# Patient Record
Sex: Male | Born: 1980 | Race: Black or African American | Hispanic: No | Marital: Single | Smoking: Current every day smoker
Health system: Southern US, Community
[De-identification: ages and names within clinical notes are randomized; demographics above are authoritative.]

---
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
## 2004-12-03 ENCOUNTER — Emergency Department (HOSPITAL_COMMUNITY): Admission: EM | Admit: 2004-12-03 | Discharge: 2004-12-03 | Payer: Self-pay | Admitting: Emergency Medicine

## 2005-08-06 ENCOUNTER — Emergency Department (HOSPITAL_COMMUNITY): Admission: EM | Admit: 2005-08-06 | Discharge: 2005-08-06 | Payer: Self-pay | Admitting: Emergency Medicine

## 2006-02-15 ENCOUNTER — Inpatient Hospital Stay (HOSPITAL_COMMUNITY): Admission: EM | Admit: 2006-02-15 | Discharge: 2006-02-20 | Payer: Self-pay | Admitting: Emergency Medicine

## 2007-01-02 ENCOUNTER — Emergency Department (HOSPITAL_COMMUNITY): Admission: EM | Admit: 2007-01-02 | Discharge: 2007-01-02 | Payer: Self-pay | Admitting: Emergency Medicine

## 2007-06-13 ENCOUNTER — Emergency Department (HOSPITAL_COMMUNITY): Admission: EM | Admit: 2007-06-13 | Discharge: 2007-06-14 | Payer: Self-pay | Admitting: Emergency Medicine

## 2009-05-01 IMAGING — CR DG FOOT COMPLETE 3+V*R*
3 series · 3 of 3 positions shown · non-contrast
Comparison: None

CLINICAL DATA: Right foot injury with medial pain

RIGHT FOOT COMPLETE - 3+ VIEW

[t foot ap right *]
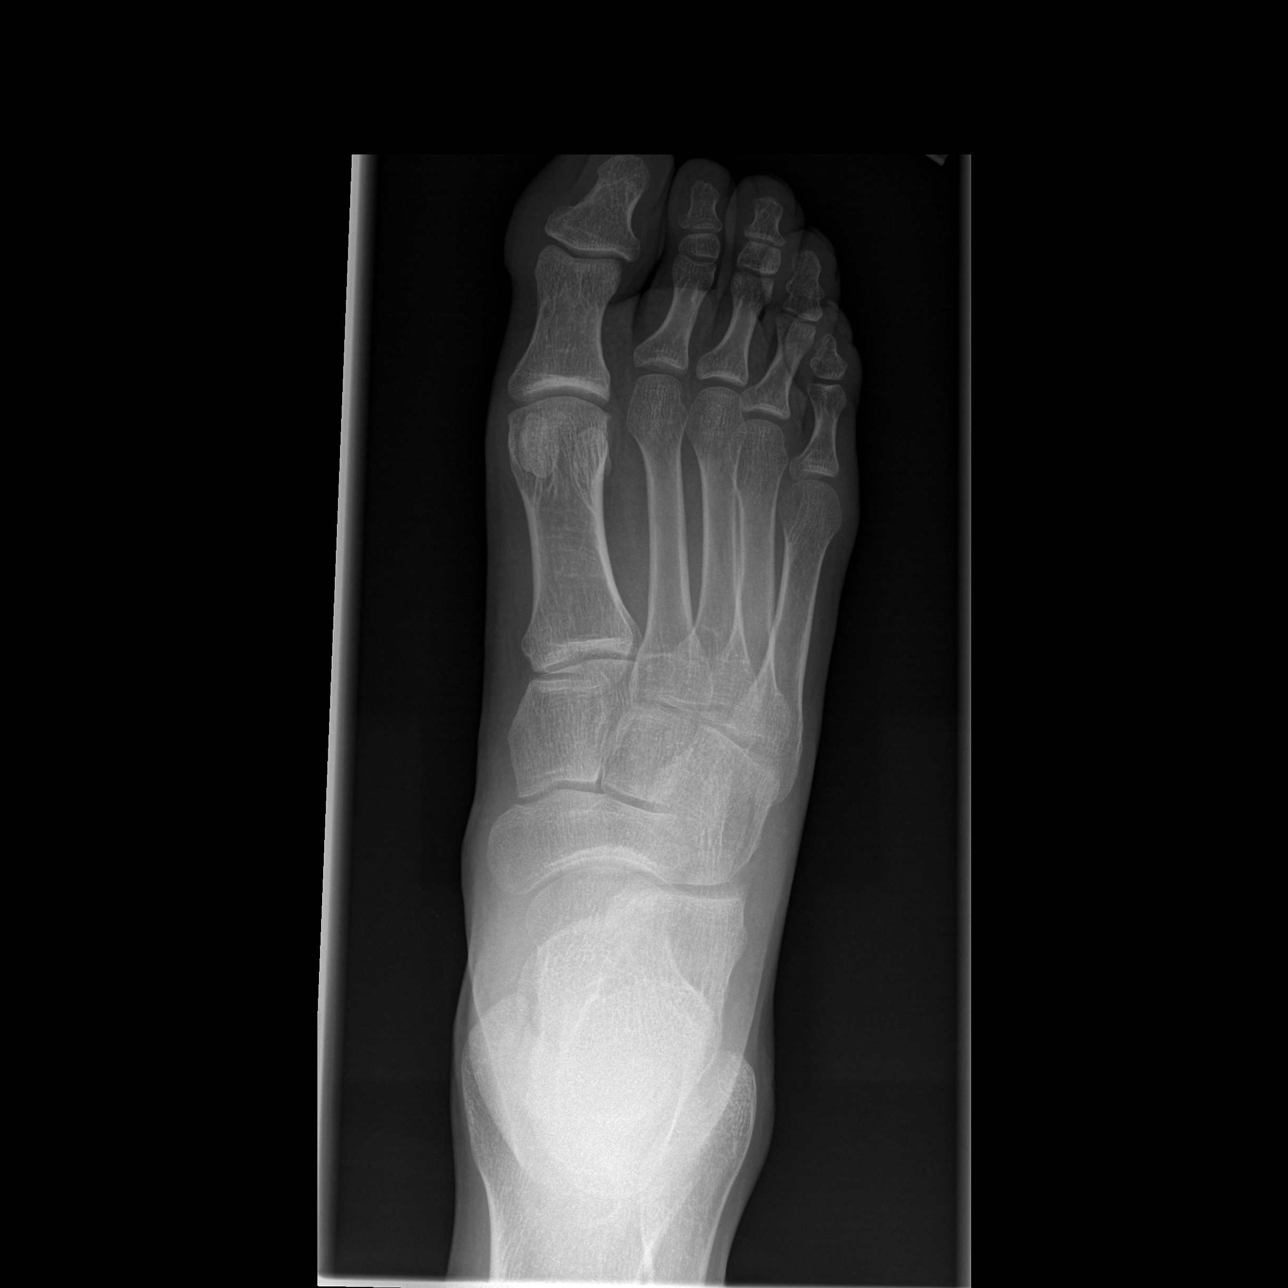

[t foot oblique right *]
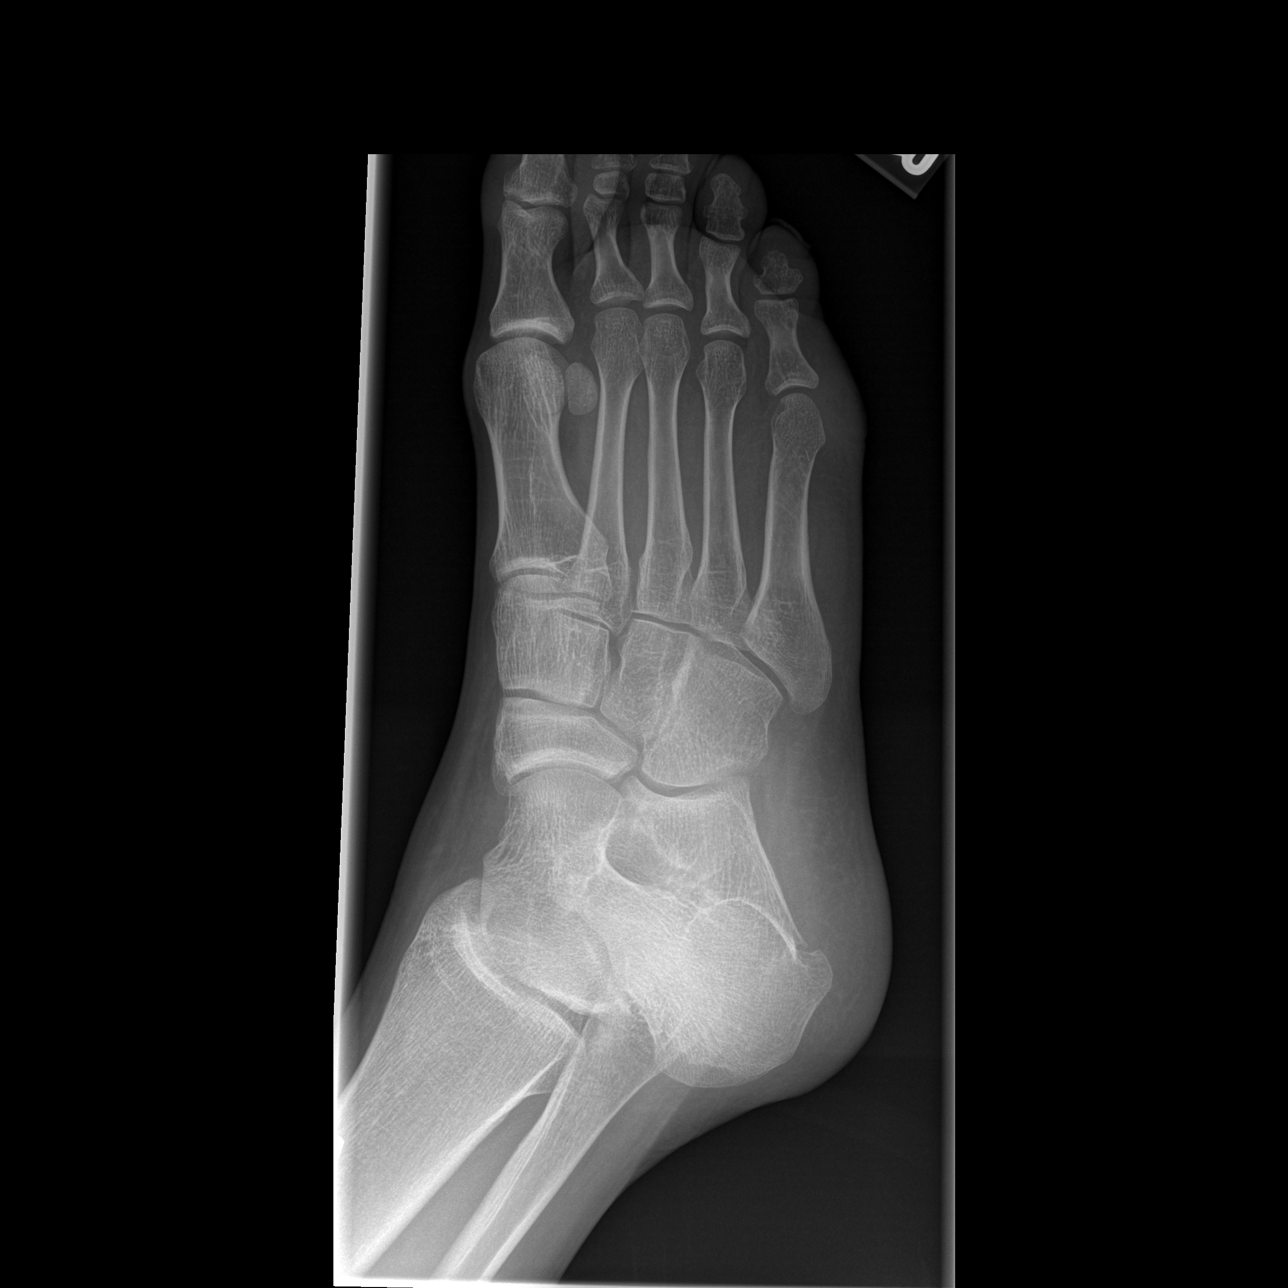

[t foot lat right *]
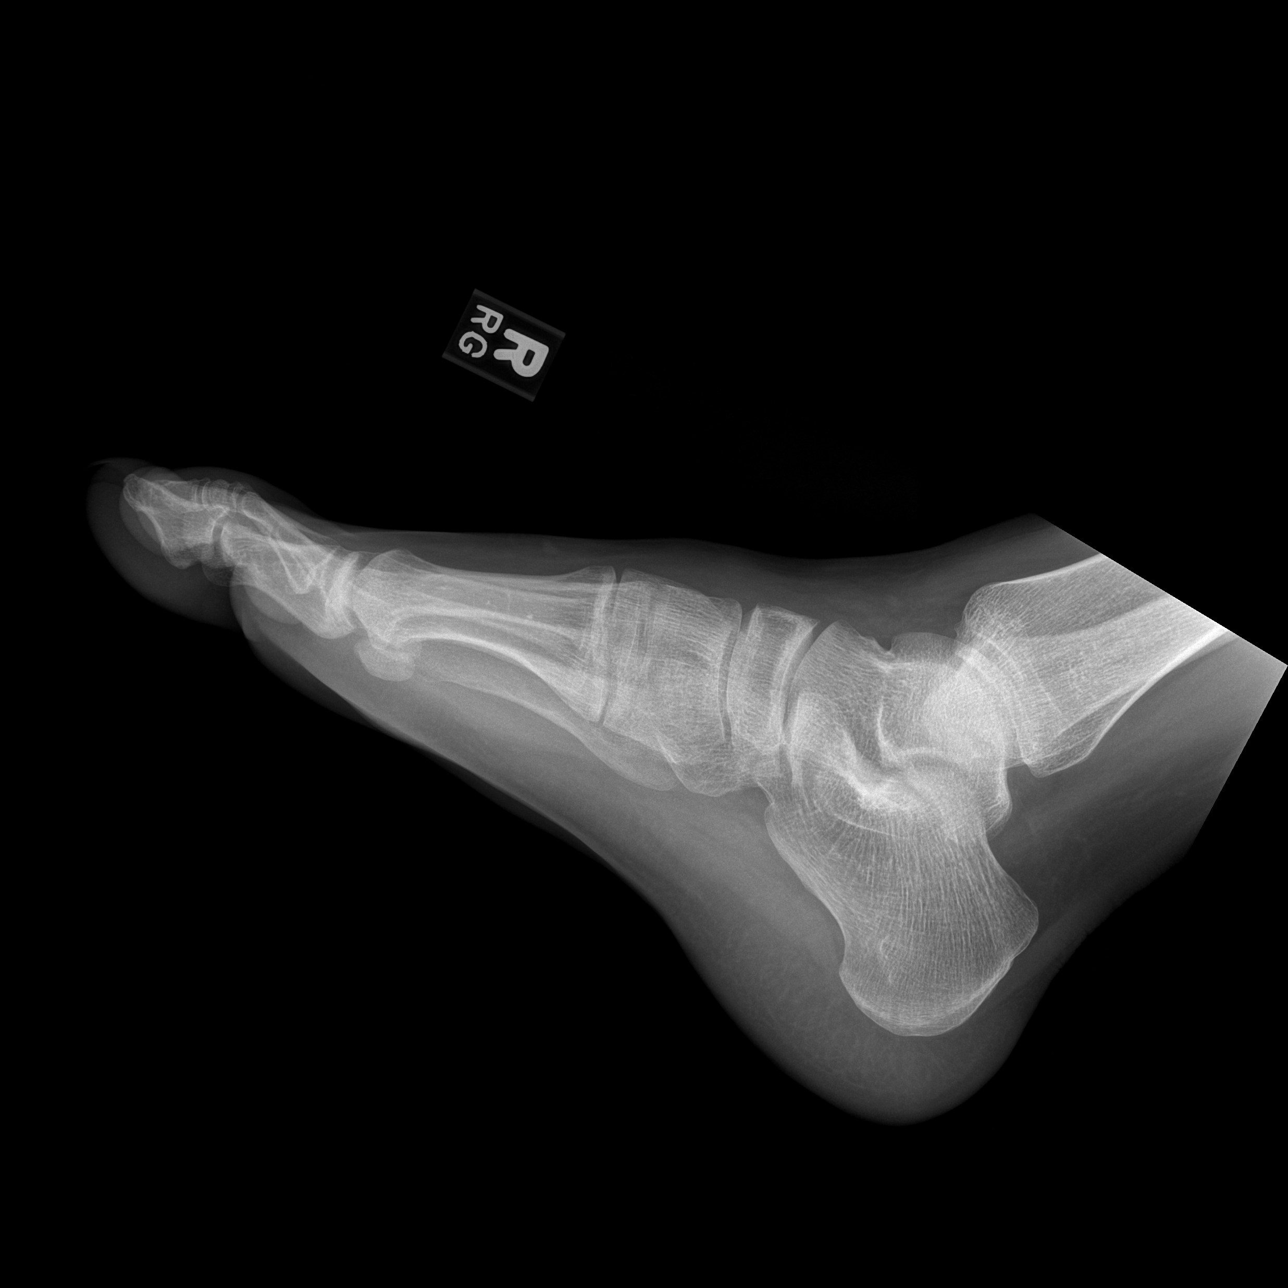

[3 of 3 positions shown; findings below may reference images not displayed]

FINDINGS: There is no evidence of fracture or dislocation.  There
is no evidence of arthropathy or other focal bone abnormality.
Soft tissues are unremarkable.
IMPRESSION: Negative.

## 2009-10-24 ENCOUNTER — Emergency Department (HOSPITAL_COMMUNITY): Admission: EM | Admit: 2009-10-24 | Discharge: 2009-10-24 | Payer: Self-pay | Admitting: Emergency Medicine

## 2010-06-15 NOTE — H&P (Signed)
[name], [name]                ACCOUNT NO.:  [id number]   MEDICAL RECORD NO.:  [id number]          PATIENT TYPE:  INP   LOCATION:  3709                         FACILITY:  [facility]   PHYSICIAN:  Della Goo, M.D. DATE OF BIRTH:  05/25/1980   DATE OF ADMISSION:  02/15/2006  DATE OF DISCHARGE:                              HISTORY & PHYSICAL   CHIEF COMPLAINT:  Dark urine.   HISTORY OF PRESENT ILLNESS:  This is a 30 year old male previously in  his state of good health until the a.m. prior to admission when he began  to pass dark brown urine.  He denies having any major discomfort,  reports having an exercise work a few days ago and has muscle soreness  in the shoulders from his work out.  He denies having any fevers,  chills, nausea, vomiting, chest pain.   The patient was evaluated in the emergency department, found to have an  elevated creatinine kinase level of 183,000 and was referred for  admission.   PAST MEDICAL HISTORY:  None.   MEDICATIONS:  None.   ALLERGIES:  No known drug allergies.   SOCIAL HISTORY:  Patient is a smoker, smokes one pack per day.  He  reports occasional alcohol usage and reports occasional cannabis usage.  Denies any other drug usages.   FAMILY HISTORY:  Positive history of hypertension in his mother.  History of gout in his family.  No history of diabetes, coronary artery  disease or cancer that he knows of.   REVIEW OF SYSTEMS:  Pertinents are mentioned above.   PHYSICAL EXAMINATION FINDINGS:  A 30 year old, well-nourished, well-  developed male in no acute distress.  Temperature 97.8, blood pressure 131/88, heart rate 60-76, respirations  16-20 and O2 saturations 98%.  HEENT:  Normocephalic, atraumatic.  Pupils equally round, reactive to  light.  Extraocular muscles are intact.  There is no scleral icterus.  Oropharynx clear.  No exudates or erythema.  NECK:  Supple.  Full range of motion.  No thyromegaly, adenopathy,  jugular venous  distension.  CARDIOVASCULAR:  Regular rate and rhythm.  LUNGS:  Clear to auscultation bilaterally.  ABDOMEN:  Positive bowel sounds, soft, nontender, nondistended.  EXTREMITIES:  Without edema.  No costovertebral angle tenderness.  No  spinous process tenderness.  NEUROLOGIC EXAMINATION:  Alert and oriented x3.  There are no focal  deficits.  GENITOURINARY/RECTAL:  Deferred.   LABORATORY STUDIES:  Reveal a white blood cell count 7.5, hemoglobin  15.1, hematocrit 45.7, MCV 94.8, platelets 221, neutrophils 64%,  lymphocytes 26%.  Urinalysis negative except for trace leukocyte  esterase.  Total CK level 183,691.  Sodium 139, potassium 3.7, chloride  110, bicarb 22.3, BUN 5, creatinine 0.9, glucose 80.  Liver function  tests:  Albumin 4.0, AST 1250, ALT 169.   ASSESSMENT:  A 30 year old male being admitted with:  1. Rhabdomyolysis.  2. Dehydration.  3. Elevated transaminases.   PLAN:  The patient will be admitted and continue receiving IV fluids for  rehydration therapy.  The patient will be placed on IV fluid with D5W  and 3 ampules of bicarb.  His electrolytes will be monitored, and the  CPK/CK levels will be checked daily along with the liver function test.      Della Goo, M.D.  Electronically Signed     HJ/MEDQ  D:  02/16/2006  T:  02/16/2006  Job:  [id number]

## 2010-06-15 NOTE — Discharge Summary (Signed)
Gary [name], Gary [name]                ACCOUNT NO.:  [id number]   MEDICAL RECORD NO.:  [id number]          PATIENT TYPE:  INP   LOCATION:  6743                         FACILITY:  [facility]   PHYSICIAN:  Elliot Cousin, M.D.    DATE OF BIRTH:  12-19-1980   DATE OF ADMISSION:  02/15/2006  DATE OF DISCHARGE:  02/20/2006                               DISCHARGE SUMMARY   DISCHARGE DIAGNOSES:  1. Rhabdomyolysis precipitated by weight lifting.  The patient's      creatine kinase on admission was 183,691.  His creatine kinase      prior to hospital discharge was 24,159.  2. Hepatic transaminitis thought to be secondary to rhabdomyolysis.  3. Tobacco abuse.  4. Asymptomatic bradycardia.  5. Human immunodeficiency virus serology pending at the time of      hospital discharge.   DISCHARGE MEDICATIONS:  1. Tylenol 500 mg 1 tablet every 6-8 hours as needed for pain (do not      exceed 4 grams daily).  2. Ibuprofen 200 mg take 1-2 tablets every 4-6 hours as needed for      pain.   DISCHARGE DISPOSITION:  The patient was discharged to home in improved  and stable condition on January24,2008.  He was advised to follow up  with the [facility] in 5-7 days.  He was also advised to call  the [facility] hospitalist office at [phone] on January28,2008 for the  results of the HIV serology.   HISTORY OF PRESENT ILLNESS:  The patient is a 30 year old man with no  significant past medical history, who presented to the emergency  department on January19,2008 with a chief complaint of urinating dark  brown urine.  He also complained of muscle soreness in his shoulders  after lifting weights a few days ago.  He denied any other complaints at  the time.  When the patient was evaluated in the emergency department,  he was found to have a creatine kinase of 183,691.  He was, therefore,  admitted for further evaluation and management.   HOSPITAL COURSE:  1. RHABDOMYOLYSIS.  The patient was started on  aggressive IV fluid      volume repletion with D5W with 3 ampules of bicarbonate  added at      250 cc an hour.  His creatine kinase levels were monitored daily.      His phosphorus and uric acid levels were also assessed and found to      be within normal limits.  It was noted that his liver transaminases      were elevated, as well.  On admission, his SGOT was 1250, and his      SGPT was 169.  The patient had no complaints of abdominal pain      and/or right upper quadrant pain during the hospitalization.  His      TSH was assessed, and it was within normal limits at 1.66.  Over      the course of the hospitalization, his urine output was closely      monitored.  The goal was to ensure that the patient's  urine output      was greater than 200 cc an hour.  During the first 48 hours of      hospitalization, his CK decreased to 149,756.  His IV fluids were      adjusted to normal saline without any bicarbonate added.  On      hospital day #4, his CK fell dramatically to approximately 98,000.      He began to feel symptomatically improved without any significant      muscle pain or myalgias.  His liver transaminases improved, as      well.  On hospital day #4, his SGOT was 909 and his SGPT was 166.      On the day of hospital discharge, his creatine kinase fell again to      24,159.  His SGOT improved, as well, to 377, and his SGPT fell to      121.  Obviously, the patient's creatine kinase and liver      transaminases were still elevated.  However, because he was      asymptomatic and the levels were significantly improved, he was      discharged to home.  The patient was anxious to be discharged, as      he had a relative who was also sick in the hospital.  He was      completely asymptomatic at the time of hospital discharge.  Prior      to his discharge, an HIV serology was ordered, as acute HIV      infection can cause rhabdomyolysis.  The results of the HIV      serology are pending .   The patient was advised to call the      [facility] office on Monday, January28,2008, for the results of the      HIV serology.  Prior to hospital discharge, the patient was also admonished to drink at  least two to three quarts of fluids daily including Gatorade, water,  juice and milk as tolerated.  He was strongly advised to follow up with  the [facility] clinic in 5-7 days to have his CK, liver transaminases,  and electrolytes reassessed.  The patient voiced understanding.  The  patient's renal function remained well within normal limits during the  hospitalization.  His BUN prior to hospital discharge was 6, and his  creatinine prior to hospital discharge was 0.9.   1. TOBACCO ABUSE.  The patient was counseled on tobacco cessation      during the hospital course.   1. ASYMPTOMATIC BRADYCARDIA.  The patient's heart rate ranged between      50 and 65 during the hospital course.  His EKG revealed a      junctional rhythm.  The patient was completely asymptomatic at the      time.  This should be followed by his primary care physician at the      [facility].   DISCHARGE LABORATORY DATA:  HIV serology pending.  Sodium 141, potassium  3.5, chloride 106, CO2 of 28, glucose 87, BUN 6, creatinine 0.9, total  bilirubin 1.6, alkaline phosphatase 45, SGOT of 377, SGPT of 121, total  protein 6.4, albumin 3.3, calcium 9.4, CK 24,159, phosphorus 4.3,  magnesium 1.9, uric acid 4.6.      Elliot Cousin, M.D.  Electronically Signed     DF/MEDQ  D:  02/23/2006  T:  02/23/2006  Job:  [id number]

## 2011-03-19 ENCOUNTER — Emergency Department (HOSPITAL_COMMUNITY): Payer: Self-pay

## 2011-03-19 ENCOUNTER — Emergency Department (HOSPITAL_COMMUNITY)
Admission: EM | Admit: 2011-03-19 | Discharge: 2011-03-19 | Disposition: A | Discharge status: Home / Self Care | Payer: Self-pay | Attending: Emergency Medicine | Admitting: Emergency Medicine

## 2011-03-19 ENCOUNTER — Encounter (HOSPITAL_COMMUNITY): Payer: Self-pay | Admitting: Emergency Medicine

## 2011-03-19 DIAGNOSIS — E876 Hypokalemia: Secondary | ICD-10-CM | POA: Insufficient documentation

## 2011-03-19 DIAGNOSIS — R109 Unspecified abdominal pain: Secondary | ICD-10-CM | POA: Insufficient documentation

## 2011-03-19 DIAGNOSIS — F17200 Nicotine dependence, unspecified, uncomplicated: Secondary | ICD-10-CM | POA: Insufficient documentation

## 2011-03-19 DIAGNOSIS — R112 Nausea with vomiting, unspecified: Secondary | ICD-10-CM | POA: Insufficient documentation

## 2011-03-19 LAB — URINALYSIS, ROUTINE W REFLEX MICROSCOPIC
Bilirubin Urine: NEGATIVE
Glucose, UA: NEGATIVE mg/dL
Hgb urine dipstick: NEGATIVE
Ketones, ur: NEGATIVE mg/dL
Leukocytes, UA: NEGATIVE
Protein, ur: NEGATIVE mg/dL
pH: 6 (ref 5.0–8.0)

## 2011-03-19 LAB — CBC
Hemoglobin: 14.3 g/dL (ref 13.0–17.0)
MCHC: 36.3 g/dL — ABNORMAL HIGH (ref 30.0–36.0)
Platelets: 274 10*3/uL (ref 150–400)
RBC: 4.63 MIL/uL (ref 4.22–5.81)

## 2011-03-19 LAB — COMPREHENSIVE METABOLIC PANEL
ALT: 25 U/L (ref 0–53)
AST: 26 U/L (ref 0–37)
Albumin: 4.6 g/dL (ref 3.5–5.2)
Alkaline Phosphatase: 68 U/L (ref 39–117)
BUN: 8 mg/dL (ref 6–23)
Chloride: 104 mEq/L (ref 96–112)
Potassium: 3.3 mEq/L — ABNORMAL LOW (ref 3.5–5.1)
Sodium: 140 mEq/L (ref 135–145)
Total Bilirubin: 0.5 mg/dL (ref 0.3–1.2)
Total Protein: 8 g/dL (ref 6.0–8.3)

## 2011-03-19 LAB — DIFFERENTIAL
Basophils Relative: 0 % (ref 0–1)
Eosinophils Absolute: 0 10*3/uL (ref 0.0–0.7)
Monocytes Relative: 9 % (ref 3–12)
Neutro Abs: 5.1 10*3/uL (ref 1.7–7.7)
Neutrophils Relative %: 68 % (ref 43–77)

## 2011-03-19 MED ORDER — HYDROMORPHONE HCL PF 1 MG/ML IJ SOLN
1.0000 mg | Freq: Once | INTRAMUSCULAR | Status: AC
  Administered 2011-03-19: 1 mg via INTRAVENOUS
  Filled 2011-03-19: qty 1

## 2011-03-19 MED ORDER — POTASSIUM CHLORIDE CRYS ER 20 MEQ PO TBCR
40.0000 meq | EXTENDED_RELEASE_TABLET | Freq: Once | ORAL | Status: AC
  Administered 2011-03-19: 40 meq via ORAL
  Filled 2011-03-19 (×2): qty 1

## 2011-03-19 MED ORDER — OXYCODONE-ACETAMINOPHEN 5-325 MG PO TABS: 2.0000 | ORAL_TABLET | ORAL | Status: AC | PRN

## 2011-03-19 MED ORDER — ONDANSETRON HCL 4 MG PO TABS: 4.0000 mg | ORAL_TABLET | Freq: Four times a day (QID) | ORAL | Status: AC

## 2011-03-19 MED ORDER — ONDANSETRON HCL 4 MG/2ML IJ SOLN
4.0000 mg | Freq: Once | INTRAMUSCULAR | Status: AC
  Administered 2011-03-19: 4 mg via INTRAVENOUS
  Filled 2011-03-19: qty 2

## 2011-03-19 MED ORDER — SODIUM CHLORIDE 0.9 % IV BOLUS (SEPSIS)
1000.0000 mL | Freq: Once | INTRAVENOUS | Status: AC
  Administered 2011-03-19: 1000 mL via INTRAVENOUS

## 2011-03-19 NOTE — ED Provider Notes (Addendum)
History     CSN: [id number]  Arrival date & time 03/19/11  4098   First MD Initiated Contact with Patient 03/19/11 2031      Chief Complaint  Patient presents with  . Abdominal Pain   Patient states pain began at 11 AM this morning. It is epigastric and diffuse. He also had some loose stool and states he vomited one time when he got to the ER. He states he took either Tylenol or Motrin for the pain. He has had no fever. He denies any pain in his groin or genital area. He has had no sick contacts. Patient denies any chronic medical problems. He does smoke cigarettes. Denies use of any alcohol or illicit drugs. The pain that he describes as nonradiating, and somewhat difficult to localize. The pain is worse with movement or palpation.  (Consider location/radiation/quality/duration/timing/severity/associated sxs/prior treatment) Patient is a 31 y.o. male presenting with abdominal pain. The history is provided by the patient.  Abdominal Pain The primary symptoms of the illness include abdominal pain. The primary symptoms of the illness do not include dysuria.    History reviewed. No pertinent past medical history.  History reviewed. No pertinent past surgical history.  No family history on file.  History  Substance Use Topics  . Smoking status: Current Everyday Smoker -- 1.0 packs/day    Types: Cigarettes  . Smokeless tobacco: Not on file  . Alcohol Use: No      Review of Systems  Gastrointestinal: Positive for abdominal pain.  Genitourinary: Negative for dysuria.  All other systems reviewed and are negative.    Allergies  Review of patient's allergies indicates no known allergies.  Home Medications  No current outpatient prescriptions on file.  BP 118/72  Pulse 89  Temp(Src) 98.1 F (36.7 C) (Oral)  Resp 22  SpO2 100%  Physical Exam  Nursing note and vitals reviewed. Constitutional: He is oriented to person, place, and time. He appears well-developed and  well-nourished. No distress.  HENT:  Head: Normocephalic and atraumatic.  Eyes: Conjunctivae and EOM are normal. Pupils are equal, round, and reactive to light.  Neck: Neck supple.  Cardiovascular: Normal rate and regular rhythm.  Exam reveals no gallop and no friction rub.   No murmur heard. Pulmonary/Chest: Breath sounds normal. He has no wheezes. He has no rales. He exhibits no tenderness.  Abdominal: Soft. Bowel sounds are normal. He exhibits no distension. There is tenderness. There is no rebound and no guarding.       Diffuse tenderness in the epigastric region. There is no focal tenderness in the right lower quadrant. No suprapubic tenderness. Bowel sounds normal to slightly hyperactive. There is no rebound, rigidity or guarding  Musculoskeletal: Normal range of motion.  Neurological: He is alert and oriented to person, place, and time. No cranial nerve deficit. Coordination normal.  Skin: Skin is warm and dry. No rash noted. He is not diaphoretic.  Psychiatric: He has a normal mood and affect.    ED Course  Procedures (including critical care time)   Labs Reviewed  CBC  DIFFERENTIAL  COMPREHENSIVE METABOLIC PANEL  URINALYSIS, ROUTINE W REFLEX MICROSCOPIC  LIPASE, BLOOD   No results found.   No diagnosis found.    MDM  Patient is seen and examined, initial history and physical is completed. Evaluation initiated    Results for orders placed during the hospital encounter of 03/19/11  CBC      Component Value Range   WBC 7.5  4.0 - 10.5 (  K/uL)   RBC 4.63  4.22 - 5.81 (MIL/uL)   Hemoglobin 14.3  13.0 - 17.0 (g/dL)   HCT 40.9  81.1 - 91.4 (%)   MCV 85.1  78.0 - 100.0 (fL)   MCH 30.9  26.0 - 34.0 (pg)   MCHC 36.3 (*) 30.0 - 36.0 (g/dL)   RDW 78.2  95.6 - 21.3 (%)   Platelets 274  150 - 400 (K/uL)  DIFFERENTIAL      Component Value Range   Neutrophils Relative 68  43 - 77 (%)   Neutro Abs 5.1  1.7 - 7.7 (K/uL)   Lymphocytes Relative 22  12 - 46 (%)   Lymphs Abs  1.6  0.7 - 4.0 (K/uL)   Monocytes Relative 9  3 - 12 (%)   Monocytes Absolute 0.7  0.1 - 1.0 (K/uL)   Eosinophils Relative 0  0 - 5 (%)   Eosinophils Absolute 0.0  0.0 - 0.7 (K/uL)   Basophils Relative 0  0 - 1 (%)   Basophils Absolute 0.0  0.0 - 0.1 (K/uL)  COMPREHENSIVE METABOLIC PANEL      Component Value Range   Sodium 140  135 - 145 (mEq/L)   Potassium 3.3 (*) 3.5 - 5.1 (mEq/L)   Chloride 104  96 - 112 (mEq/L)   CO2 20  19 - 32 (mEq/L)   Glucose, Bld 111 (*) 70 - 99 (mg/dL)   BUN 8  6 - 23 (mg/dL)   Creatinine, Ser 0.86  0.50 - 1.35 (mg/dL)   Calcium 9.3  8.4 - 57.8 (mg/dL)   Total Protein 8.0  6.0 - 8.3 (g/dL)   Albumin 4.6  3.5 - 5.2 (g/dL)   AST 26  0 - 37 (U/L)   ALT 25  0 - 53 (U/L)   Alkaline Phosphatase 68  39 - 117 (U/L)   Total Bilirubin 0.5  0.3 - 1.2 (mg/dL)   GFR calc non Af Amer >90  >90 (mL/min)   GFR calc Af Amer >90  >90 (mL/min)  URINALYSIS, ROUTINE W REFLEX MICROSCOPIC      Component Value Range   Color, Urine YELLOW  YELLOW    APPearance CLEAR  CLEAR    Specific Gravity, Urine 1.016  1.005 - 1.030    pH 6.0  5.0 - 8.0    Glucose, UA NEGATIVE  NEGATIVE (mg/dL)   Hgb urine dipstick NEGATIVE  NEGATIVE    Bilirubin Urine NEGATIVE  NEGATIVE    Ketones, ur NEGATIVE  NEGATIVE (mg/dL)   Protein, ur NEGATIVE  NEGATIVE (mg/dL)   Urobilinogen, UA 0.2  0.0 - 1.0 (mg/dL)   Nitrite NEGATIVE  NEGATIVE    Leukocytes, UA NEGATIVE  NEGATIVE   LIPASE, BLOOD      Component Value Range   Lipase 22  11 - 59 (U/L)   Dg Abd Acute W/chest  03/19/2011  *RADIOLOGY REPORT*  Clinical Data: Mid abdominal pain  ACUTE ABDOMEN SERIES (ABDOMEN 2 VIEW & CHEST 1 VIEW)  Comparison: 01/02/2007  Findings: Low lung volumes accentuate the vascular markings. Normal heart size.  No definite focal pneumonia, collapse, consolidation, effusion or pneumothorax.  Trachea midline.  No free air.  Nonobstructive bowel gas pattern.  No significant dilatation or ileus.  No abnormal calcifications or  osseous finding.  Pelvic calcifications likely venous phlebolith.  IMPRESSION: No acute findings in the chest or abdomen by plain radiography.  Original Report Authenticated By: Judie Petit. Ruel Favors, M.D.      Initial diagnostic studies, fairly unremarkable.  White count is normal. LFTs normal. Lipase is normal. Urine is normal. Abdominal x-rays normal.   Patient does feel much better. He is walking around pushing of the stomach, and, states the pain is completely gone. I feel the etiology is likely secondary to a viral gastroenteritis . He was told to take clear liquids for the next 24 hours. He is given prescriptions. Patient is instructed to return to the ED for any concerns or changing or persistent symptoms, but at this time. His pain is completely resolved    Rainie Crenshaw A. Patrica Duel, MD 03/19/11 2326  Lorelle Gibbs. Patrica Duel, MD 03/19/11 2330

## 2011-03-19 NOTE — Discharge Instructions (Signed)
Your test in the ER this evening were normal, please return to the ER  for any concerns or changing symptoms. Otherwise, as discussed take a very bland diet or just clear liquids for the next 24 hours. Take medications as prescribed. Follow instructions below    Abdominal Pain (Nonspecific) Your exam might not show the exact reason you have abdominal pain. Since there are many different causes of abdominal pain, another checkup and more tests may be needed. It is very important to follow up for lasting (persistent) or worsening symptoms. A possible cause of abdominal pain in any person who still has his or her appendix is acute appendicitis. Appendicitis is often hard to diagnose. Normal blood tests, urine tests, ultrasound, and CT scans do not completely rule out early appendicitis or other causes of abdominal pain. Sometimes, only the changes that happen over time will allow appendicitis and other causes of abdominal pain to be determined. Other potential problems that may require surgery may also take time to become more apparent. Because of this, it is important that you follow all of the instructions below. HOME CARE INSTRUCTIONS   Rest as much as possible.   Do not eat solid food until your pain is gone.   While adults or children have pain: A diet of water, weak decaffeinated tea, broth or bouillon, gelatin, oral rehydration solutions (ORS), frozen ice pops, or ice chips may be helpful.   When pain is gone in adults or children: Start a light diet (dry toast, crackers, applesauce, or white rice). Increase the diet slowly as long as it does not bother you. Eat no dairy products (including cheese and eggs) and no spicy, fatty, fried, or high-fiber foods.   Use no alcohol, caffeine, or cigarettes.   Take your regular medicines unless your caregiver told you not to.   Take any prescribed medicine as directed.   Only take over-the-counter or prescription medicines for pain, discomfort, or  fever as directed by your caregiver. Do not give aspirin to children.  If your caregiver has given you a follow-up appointment, it is very important to keep that appointment. Not keeping the appointment could result in a permanent injury and/or lasting (chronic) pain and/or disability. If there is any problem keeping the appointment, you must call to reschedule.  SEEK IMMEDIATE MEDICAL CARE IF:   Your pain is not gone in 24 hours.   Your pain becomes worse, changes location, or feels different.   You or your child has an oral temperature above 102 F (38.9 C), not controlled by medicine.   Your baby is older than 3 months with a rectal temperature of 102 F (38.9 C) or higher.   Your baby is 30 months old or younger with a rectal temperature of 100.4 F (38 C) or higher.   You have shaking chills.   You keep throwing up (vomiting) or cannot drink liquids.   There is blood in your vomit or you see blood in your bowel movements.   Your bowel movements become dark or black.   You have frequent bowel movements.   Your bowel movements stop (become blocked) or you cannot pass gas.   You have bloody, frequent, or painful urination.   You have yellow discoloration in the skin or whites of the eyes.   Your stomach becomes bloated or bigger.   You have dizziness or fainting.   You have chest or back pain.  MAKE SURE YOU:   Understand these instructions.   Will  watch your condition.   Will get help right away if you are not doing well or get worse.  Document Released: 01/14/2005 Document Revised: 09/26/2010 Document Reviewed: 12/12/2008 [facility] Patient Information 2012 [place], [place].   Viral Gastroenteritis Viral gastroenteritis is also known as stomach flu. This condition affects the stomach and intestinal tract. The illness typically lasts 3 to 8 days. Most people develop an immune response. This eventually gets rid of the virus. While this natural response develops, the  virus can make you quite ill.  CAUSES  Diarrhea and vomiting are often caused by a virus. Medicines (antibiotics) that kill germs will not help unless there is also a germ (bacterial) infection. SYMPTOMS  The most common symptom is diarrhea. This can cause severe loss of fluids (dehydration) and body salt (electrolyte) imbalance. TREATMENT  Treatments for this illness are aimed at rehydration. Antidiarrheal medicines are not recommended. They do not decrease diarrhea volume and may be harmful. Usually, home treatment is all that is needed. The most serious cases involve vomiting so severely that you are not able to keep down fluids taken by mouth (orally). In these cases, intravenous (IV) fluids are needed. Vomiting with viral gastroenteritis is common, but it will usually go away with treatment. HOME CARE INSTRUCTIONS  Small amounts of fluids should be taken frequently. Large amounts at one time may not be tolerated. Plain water may be harmful in infants and the elderly. Oral rehydration solutions (ORS) are available at pharmacies and grocery stores. ORS replace water and important electrolytes in proper proportions. Sports drinks are not as effective as ORS and may be harmful due to sugars worsening diarrhea.  As a general guideline for children, replace any new fluid losses from diarrhea or vomiting with ORS as follows:   If your child weighs 22 pounds or under (10 kg or less), give 60-120 mL (1/4 - 1/2 cup or 2 - 4 ounces) of ORS for each diarrheal stool or vomiting episode.   If your child weighs more than 22 pounds (more than 10 kgs), give 120-240 mL (1/2 - 1 cup or 4 - 8 ounces) of ORS for each diarrheal stool or vomiting episode.   In a child with vomiting, it may be helpful to give the above ORS replacement in 5 mL (1 teaspoon) amounts every 5 minutes, then increase as tolerated.   While correcting for dehydration, children should eat normally. However, foods high in sugar should be  avoided because this may worsen diarrhea. Large amounts of carbonated soft drinks, juice, gelatin desserts, and other highly sugared drinks should be avoided.   After correction of dehydration, other liquids that are appealing to the child may be added. Children should drink small amounts of fluids frequently and fluids should be increased as tolerated.   Adults should eat normally while drinking more fluids than usual. Drink small amounts of fluids frequently and increase as tolerated. Drink enough water and fluids to keep your urine clear or pale yellow. Broths, weak decaffeinated tea, lemon-lime soft drinks (allowed to go flat), and ORS replace fluids and electrolytes.   Avoid:   Carbonated drinks.   Juice.   Extremely hot or cold fluids.   Caffeine drinks.   Fatty, greasy foods.   Alcohol.   Tobacco.   Too much intake of anything at one time.   Gelatin desserts.   Probiotics are active cultures of beneficial bacteria. They may lessen the amount and number of diarrheal stools in adults. Probiotics can be found in yogurt  with active cultures and in supplements.   Wash your hands well to avoid spreading bacteria and viruses.   Antidiarrheal medicines are not recommended for infants and children.   Only take over-the-counter or prescription medicines for pain, discomfort, or fever as directed by your caregiver. Do not give aspirin to children.   For adults with dehydration, ask your caregiver if you should continue all prescribed and over-the-counter medicines.   If your caregiver has given you a follow-up appointment, it is very important to keep that appointment. Not keeping the appointment could result in a lasting (chronic) or permanent injury and disability. If there is any problem keeping the appointment, you must call to reschedule.  SEEK IMMEDIATE MEDICAL CARE IF:   You are unable to keep fluids down.   There is no urine output in 6 to 8 hours or there is only a small  amount of very dark urine.   You develop shortness of breath.   There is blood in the vomit (may look like coffee grounds) or stool.   Belly (abdominal) pain develops, increases, or localizes.   There is persistent vomiting or diarrhea.   You have a fever.   Your baby is older than 3 months with a rectal temperature of 102 F (38.9 C) or higher.   Your baby is 50 months old or younger with a rectal temperature of 100.4 F (38 C) or higher.  MAKE SURE YOU:   Understand these instructions.   Will watch your condition.   Will get help right away if you are not doing well or get worse.  Document Released: 01/14/2005 Document Revised: 09/26/2010 Document Reviewed: 05/28/2006 [facility] Patient Information 2012 [place], [place].

## 2011-03-19 NOTE — ED Notes (Signed)
Pt alert, nad, c/o epigastric pain, onset today, states pain sharp, non radiating, "constant pain, all day", resp even unlabored, skin pwd, emesis in triage

## 2011-05-04 ENCOUNTER — Encounter (HOSPITAL_COMMUNITY): Payer: Self-pay | Admitting: Certified Registered"

## 2011-05-04 ENCOUNTER — Encounter (HOSPITAL_COMMUNITY): Payer: Self-pay | Admitting: General Surgery

## 2011-05-04 ENCOUNTER — Encounter (HOSPITAL_COMMUNITY): Payer: Self-pay | Admitting: *Deleted

## 2011-05-04 ENCOUNTER — Encounter (HOSPITAL_COMMUNITY)
Admission: EM | Disposition: A | Discharge status: Home / Self Care | Payer: Self-pay | Source: Home / Self Care | Attending: Emergency Medicine

## 2011-05-04 ENCOUNTER — Emergency Department (HOSPITAL_COMMUNITY): Payer: Self-pay

## 2011-05-04 ENCOUNTER — Ambulatory Visit (HOSPITAL_COMMUNITY)
Admission: EM | Admit: 2011-05-04 | Discharge: 2011-05-05 | Disposition: A | Discharge status: Home / Self Care | Payer: Self-pay | Attending: Emergency Medicine | Admitting: Emergency Medicine

## 2011-05-04 ENCOUNTER — Emergency Department (HOSPITAL_COMMUNITY): Payer: Self-pay | Admitting: Certified Registered"

## 2011-05-04 DIAGNOSIS — K3580 Unspecified acute appendicitis: Secondary | ICD-10-CM | POA: Insufficient documentation

## 2011-05-04 DIAGNOSIS — F17200 Nicotine dependence, unspecified, uncomplicated: Secondary | ICD-10-CM | POA: Insufficient documentation

## 2011-05-04 HISTORY — PX: LAPAROSCOPIC APPENDECTOMY: SHX408

## 2011-05-04 LAB — CBC
Hemoglobin: 15.1 g/dL (ref 13.0–17.0)
MCH: 31.5 pg (ref 26.0–34.0)
MCV: 86.7 fL (ref 78.0–100.0)
MCV: 87.1 fL (ref 78.0–100.0)
Platelets: 255 10*3/uL (ref 150–400)
Platelets: 237 10*3/uL (ref 150–400)
RBC: 4.8 MIL/uL (ref 4.22–5.81)
RBC: 4.42 MIL/uL (ref 4.22–5.81)
RDW: 13.2 % (ref 11.5–15.5)
WBC: 5.8 10*3/uL (ref 4.0–10.5)
WBC: 9.4 10*3/uL (ref 4.0–10.5)

## 2011-05-04 LAB — COMPREHENSIVE METABOLIC PANEL
ALT: 38 U/L (ref 0–53)
Alkaline Phosphatase: 70 U/L (ref 39–117)
BUN: 10 mg/dL (ref 6–23)
CO2: 22 mEq/L (ref 19–32)
GFR calc Af Amer: >90 mL/min (ref >90)
GFR calc non Af Amer: >90 mL/min (ref >90)
Glucose, Bld: 104 mg/dL — ABNORMAL HIGH (ref 70–99)
Potassium: 3.5 mEq/L (ref 3.5–5.1)
Sodium: 140 mEq/L (ref 135–145)

## 2011-05-04 LAB — URINALYSIS, ROUTINE W REFLEX MICROSCOPIC
Bilirubin Urine: NEGATIVE
Glucose, UA: NEGATIVE mg/dL
Hgb urine dipstick: NEGATIVE
Specific Gravity, Urine: 1.022 (ref 1.005–1.030)
Urobilinogen, UA: 0.2 mg/dL (ref 0.0–1.0)
pH: 5.5 (ref 5.0–8.0)

## 2011-05-04 LAB — CREATININE, SERUM
Creatinine, Ser: 0.9 mg/dL (ref 0.50–1.35)
GFR calc Af Amer: >90 mL/min (ref >90)
GFR calc non Af Amer: >90 mL/min (ref >90)

## 2011-05-04 LAB — LIPASE, BLOOD: Lipase: 33 U/L (ref 11–59)

## 2011-05-04 LAB — DIFFERENTIAL
Eosinophils Relative: 2 % (ref 0–5)
Lymphocytes Relative: 42 % (ref 12–46)
Lymphs Abs: 2.4 10*3/uL (ref 0.7–4.0)
Monocytes Relative: 13 % — ABNORMAL HIGH (ref 3–12)
Neutrophils Relative %: 42 % — ABNORMAL LOW (ref 43–77)

## 2011-05-04 SURGERY — APPENDECTOMY, LAPAROSCOPIC
Anesthesia: General | Site: Abdomen | Wound class: Contaminated

## 2011-05-04 MED ORDER — NEOSTIGMINE METHYLSULFATE 1 MG/ML IJ SOLN
INTRAMUSCULAR | Status: DC | PRN
  Administered 2011-05-04: 5 mg via INTRAVENOUS

## 2011-05-04 MED ORDER — METOCLOPRAMIDE HCL 5 MG/ML IJ SOLN
INTRAMUSCULAR | Status: DC | PRN
  Administered 2011-05-04: 10 mg via INTRAVENOUS

## 2011-05-04 MED ORDER — HYDROMORPHONE HCL PF 1 MG/ML IJ SOLN
1.0000 mg | Freq: Once | INTRAMUSCULAR | Status: AC
  Administered 2011-05-04: 1 mg via INTRAVENOUS
  Filled 2011-05-04: qty 1

## 2011-05-04 MED ORDER — ONDANSETRON HCL 4 MG/2ML IJ SOLN
4.0000 mg | Freq: Once | INTRAMUSCULAR | Status: AC
  Administered 2011-05-04: 4 mg via INTRAVENOUS
  Filled 2011-05-04: qty 2

## 2011-05-04 MED ORDER — KETOROLAC TROMETHAMINE 30 MG/ML IJ SOLN
INTRAMUSCULAR | Status: DC | PRN
  Administered 2011-05-04: 30 mg via INTRAVENOUS

## 2011-05-04 MED ORDER — DROPERIDOL 2.5 MG/ML IJ SOLN
INTRAMUSCULAR | Status: DC | PRN
  Administered 2011-05-04: 0.625 mg via INTRAVENOUS

## 2011-05-04 MED ORDER — IBUPROFEN 600 MG PO TABS
600.0000 mg | ORAL_TABLET | Freq: Three times a day (TID) | ORAL | Status: DC | PRN
  Filled 2011-05-04 (×3): qty 1

## 2011-05-04 MED ORDER — IOHEXOL 300 MG/ML  SOLN
100.0000 mL | Freq: Once | INTRAMUSCULAR | Status: AC | PRN
  Administered 2011-05-04: 100 mL via INTRAVENOUS

## 2011-05-04 MED ORDER — PROPOFOL 10 MG/ML IV EMUL
INTRAVENOUS | Status: DC | PRN
  Administered 2011-05-04: 50 mg via INTRAVENOUS
  Administered 2011-05-04: 200 mg via INTRAVENOUS

## 2011-05-04 MED ORDER — FENTANYL CITRATE 0.05 MG/ML IJ SOLN
INTRAMUSCULAR | Status: DC | PRN
  Administered 2011-05-04: 150 ug via INTRAVENOUS
  Administered 2011-05-04: 50 ug via INTRAVENOUS
  Administered 2011-05-04: 150 ug via INTRAVENOUS
  Administered 2011-05-04 (×2): 100 ug via INTRAVENOUS

## 2011-05-04 MED ORDER — SODIUM CHLORIDE 0.9 % IV SOLN
1.0000 g | INTRAVENOUS | Status: AC
  Administered 2011-05-04: 1 g via INTRAVENOUS
  Filled 2011-05-04: qty 1

## 2011-05-04 MED ORDER — MORPHINE SULFATE 4 MG/ML IJ SOLN
INTRAMUSCULAR | Status: AC
  Administered 2011-05-04: 4 mg
  Filled 2011-05-04: qty 1

## 2011-05-04 MED ORDER — VECURONIUM BROMIDE 10 MG IV SOLR
INTRAVENOUS | Status: DC | PRN
  Administered 2011-05-04: 7 mg via INTRAVENOUS

## 2011-05-04 MED ORDER — LACTATED RINGERS IV SOLN
INTRAVENOUS | Status: DC | PRN
  Administered 2011-05-04 (×2): via INTRAVENOUS

## 2011-05-04 MED ORDER — DIPHENHYDRAMINE HCL 25 MG PO CAPS
25.0000 mg | ORAL_CAPSULE | ORAL | Status: DC | PRN
  Administered 2011-05-05: 25 mg via ORAL
  Filled 2011-05-04: qty 1

## 2011-05-04 MED ORDER — MORPHINE SULFATE 2 MG/ML IJ SOLN
1.0000 mg | INTRAMUSCULAR | Status: DC | PRN
  Administered 2011-05-05: 2 mg via INTRAVENOUS
  Filled 2011-05-04: qty 1

## 2011-05-04 MED ORDER — DEXTROSE IN LACTATED RINGERS 5 % IV SOLN
INTRAVENOUS | Status: DC
  Administered 2011-05-05: 03:00:00 via INTRAVENOUS

## 2011-05-04 MED ORDER — LORAZEPAM 2 MG/ML IJ SOLN: 1.0000 mg | Freq: Once | INTRAMUSCULAR | Status: DC | PRN

## 2011-05-04 MED ORDER — SODIUM CHLORIDE 0.9 % IV SOLN
1.0000 g | INTRAVENOUS | Status: DC
  Filled 2011-05-04: qty 1

## 2011-05-04 MED ORDER — ONDANSETRON HCL 4 MG/2ML IJ SOLN
INTRAMUSCULAR | Status: DC | PRN
  Administered 2011-05-04: 4 mg via INTRAVENOUS

## 2011-05-04 MED ORDER — LIDOCAINE HCL (CARDIAC) 20 MG/ML IV SOLN
INTRAVENOUS | Status: DC | PRN
  Administered 2011-05-04: 70 mg via INTRAVENOUS

## 2011-05-04 MED ORDER — FENTANYL CITRATE 0.05 MG/ML IJ SOLN: 50.0000 ug | INTRAMUSCULAR | Status: DC | PRN

## 2011-05-04 MED ORDER — MORPHINE SULFATE 4 MG/ML IJ SOLN
4.0000 mg | Freq: Once | INTRAMUSCULAR | Status: AC
  Administered 2011-05-04: 4 mg via INTRAVENOUS
  Filled 2011-05-04: qty 1

## 2011-05-04 MED ORDER — DEXAMETHASONE SODIUM PHOSPHATE 4 MG/ML IJ SOLN
INTRAMUSCULAR | Status: DC | PRN
  Administered 2011-05-04: 8 mg via INTRAVENOUS

## 2011-05-04 MED ORDER — IOHEXOL 300 MG/ML  SOLN
20.0000 mL | INTRAMUSCULAR | Status: AC
  Administered 2011-05-04: 20 mL via ORAL

## 2011-05-04 MED ORDER — SUCCINYLCHOLINE CHLORIDE 20 MG/ML IJ SOLN
INTRAMUSCULAR | Status: DC | PRN
  Administered 2011-05-04: 140 mg via INTRAVENOUS

## 2011-05-04 MED ORDER — MIDAZOLAM HCL 5 MG/5ML IJ SOLN
INTRAMUSCULAR | Status: DC | PRN
  Administered 2011-05-04: 2 mg via INTRAVENOUS

## 2011-05-04 MED ORDER — ONDANSETRON HCL 4 MG PO TABS: 4.0000 mg | ORAL_TABLET | Freq: Four times a day (QID) | ORAL | Status: DC | PRN

## 2011-05-04 MED ORDER — MIDAZOLAM HCL 2 MG/2ML IJ SOLN: 1.0000 mg | INTRAMUSCULAR | Status: DC | PRN

## 2011-05-04 MED ORDER — OXYCODONE-ACETAMINOPHEN 5-325 MG PO TABS: 1.0000 | ORAL_TABLET | ORAL | Status: DC | PRN

## 2011-05-04 MED ORDER — MORPHINE SULFATE 4 MG/ML IJ SOLN
8.0000 mg | Freq: Once | INTRAMUSCULAR | Status: AC
  Administered 2011-05-04: 8 mg via INTRAVENOUS
  Filled 2011-05-04: qty 2

## 2011-05-04 MED ORDER — ONDANSETRON HCL 4 MG/2ML IJ SOLN: 4.0000 mg | Freq: Four times a day (QID) | INTRAMUSCULAR | Status: DC | PRN

## 2011-05-04 MED ORDER — GLYCOPYRROLATE 0.2 MG/ML IJ SOLN
INTRAMUSCULAR | Status: DC | PRN
  Administered 2011-05-04: .6 mg via INTRAVENOUS

## 2011-05-04 MED ORDER — KETOROLAC TROMETHAMINE 30 MG/ML IJ SOLN
30.0000 mg | Freq: Four times a day (QID) | INTRAMUSCULAR | Status: DC
  Administered 2011-05-04 – 2011-05-05 (×2): 30 mg via INTRAVENOUS
  Filled 2011-05-04 (×7): qty 1

## 2011-05-04 MED ORDER — HYDROMORPHONE HCL PF 1 MG/ML IJ SOLN: 0.2500 mg | INTRAMUSCULAR | Status: DC | PRN

## 2011-05-04 MED ORDER — SODIUM CHLORIDE 0.9 % IV SOLN
Freq: Once | INTRAVENOUS | Status: AC
  Administered 2011-05-04: 14:00:00 via INTRAVENOUS

## 2011-05-04 MED ORDER — ENOXAPARIN SODIUM 40 MG/0.4ML ~~LOC~~ SOLN
40.0000 mg | SUBCUTANEOUS | Status: DC
  Filled 2011-05-04: qty 0.4

## 2011-05-04 SURGICAL SUPPLY — 43 items
APPLIER CLIP ROT 10 11.4 M/L (STAPLE)
BLADE SURG ROTATE 9660 (MISCELLANEOUS) ×2 IMPLANT
CANISTER SUCTION 2500CC (MISCELLANEOUS) ×2 IMPLANT
CHLORAPREP W/TINT 26ML (MISCELLANEOUS) ×2 IMPLANT
CLIP APPLIE ROT 10 11.4 M/L (STAPLE) IMPLANT
CLOTH BEACON ORANGE TIMEOUT ST (SAFETY) ×2 IMPLANT
COVER SURGICAL LIGHT HANDLE (MISCELLANEOUS) ×2 IMPLANT
CUTTER FLEX LINEAR 45M (STAPLE) ×4 IMPLANT
DERMABOND ADHESIVE PROPEN (GAUZE/BANDAGES/DRESSINGS) ×1
DERMABOND ADVANCED (GAUZE/BANDAGES/DRESSINGS) ×1
DERMABOND ADVANCED .7 DNX12 (GAUZE/BANDAGES/DRESSINGS) ×1 IMPLANT
DERMABOND ADVANCED .7 DNX6 (GAUZE/BANDAGES/DRESSINGS) ×1 IMPLANT
DRAPE UTILITY 15X26 W/TAPE STR (DRAPE) ×4 IMPLANT
DRAPE WARM FLUID 44X44 (DRAPE) ×2 IMPLANT
ELECT REM PT RETURN 9FT ADLT (ELECTROSURGICAL) ×2
ELECTRODE REM PT RTRN 9FT ADLT (ELECTROSURGICAL) ×1 IMPLANT
ENDOLOOP SUT PDS II  0 18 (SUTURE)
ENDOLOOP SUT PDS II 0 18 (SUTURE) IMPLANT
GLOVE BIO SURGEON STRL SZ 6 (GLOVE) ×2 IMPLANT
GLOVE BIO SURGEON STRL SZ7 (GLOVE) ×4 IMPLANT
GLOVE BIOGEL PI IND STRL 6.5 (GLOVE) ×2 IMPLANT
GLOVE BIOGEL PI INDICATOR 6.5 (GLOVE) ×2
GOWN PREVENTION PLUS XXLARGE (GOWN DISPOSABLE) ×4 IMPLANT
GOWN STRL NON-REIN LRG LVL3 (GOWN DISPOSABLE) ×4 IMPLANT
KIT BASIN OR (CUSTOM PROCEDURE TRAY) ×2 IMPLANT
KIT ROOM TURNOVER OR (KITS) ×2 IMPLANT
NS IRRIG 1000ML POUR BTL (IV SOLUTION) ×2 IMPLANT
PAD ARMBOARD 7.5X6 YLW CONV (MISCELLANEOUS) ×4 IMPLANT
POUCH SPECIMEN RETRIEVAL 10MM (ENDOMECHANICALS) ×2 IMPLANT
RELOAD STAPLE TA45 3.5 REG BLU (ENDOMECHANICALS) ×4 IMPLANT
SCALPEL HARMONIC ACE (MISCELLANEOUS) ×2 IMPLANT
SET IRRIG TUBING LAPAROSCOPIC (IRRIGATION / IRRIGATOR) ×2 IMPLANT
SLEEVE ENDOPATH XCEL 5M (ENDOMECHANICALS) ×2 IMPLANT
SPECIMEN JAR SMALL (MISCELLANEOUS) ×2 IMPLANT
SUT MNCRL AB 4-0 PS2 18 (SUTURE) ×2 IMPLANT
TOWEL OR 17X24 6PK STRL BLUE (TOWEL DISPOSABLE) ×2 IMPLANT
TOWEL OR 17X26 10 PK STRL BLUE (TOWEL DISPOSABLE) ×2 IMPLANT
TRAY FOLEY CATH 14FR (SET/KITS/TRAYS/PACK) ×2 IMPLANT
TRAY LAPAROSCOPIC (CUSTOM PROCEDURE TRAY) ×2 IMPLANT
TROCAR BLADELESS 12MM (ENDOMECHANICALS) ×2 IMPLANT
TROCAR XCEL BLUNT TIP 100MML (ENDOMECHANICALS) ×2 IMPLANT
TROCAR XCEL NON-BLD 5MMX100MML (ENDOMECHANICALS) ×2 IMPLANT
WATER STERILE IRR 1000ML POUR (IV SOLUTION) IMPLANT

## 2011-05-04 NOTE — ED Notes (Signed)
Report given to Marylene Land in [place] and pt is going upstairs to or and Dr Donell Beers will talk with pt about the surgery.

## 2011-05-04 NOTE — Op Note (Signed)
Appendectomy, Lap, Procedure Note  Indications: The patient presented with a history of right-sided abdominal pain. A CTq revealed findings consistent with acute appendicitis.  Pre-operative Diagnosis: Early acute appendicitis  Post-operative Diagnosis: Same  Surgeon: [facility]   Assistants: None  Anesthesia: General endotracheal anesthesia and Local anesthesia 1% buffered lidocaine, 0.25.% bupivacaine, with epinephrine  ASA Class: 2E  Procedure Details  The patient was seen again in the Holding Room. The risks, benefits, complications, treatment options, and expected outcomes were discussed with the patient and/or family. The possibilities of perforation of viscus, bleeding, recurrent infection, the need for additional procedures, failure to diagnose a condition, and creating a complication requiring transfusion or operation were discussed. There was concurrence with the proposed plan and informed consent was obtained. The site of surgery was properly noted. The patient was taken to Operating Room, identified as Gary [name] and the procedure verified as Appendectomy. A Time Out was held and the above information confirmed.  The patient was placed in the supine position and general anesthesia was induced, along with placement of orogastric tube, Venodyne boots, and a Foley catheter. The abdomen was prepped and draped in a sterile fashion. The patient was placed into reverse trendelenburg position and rotated to the right.  Local anesthetic was infiltrated into the left costal margin.  A 5 mm incision was made with the #11 blade.  The 5 mm optiview port was used to access the abdominal cavity.  The pneumoperitoneum was then established to steady pressure of 15 mmHg.  Additional 5 mm cannulas then placed in the left lower quadrant of the abdomen and the suprapubic region under direct visualization. The left subcostal incision was upsized to a 12 mm port.  A careful evaluation of the entire  abdomen was carried out. The patient was placed in Trendelenburg and rotated to the left.  . The small intestines were retracted in the cephalad and left lateral direction away from the pelvis and right lower quadrant. The patient was found to have an enlarged and inflamed appendix that was extending into the pelvis. There was no evidence of perforation.  The appendix was carefully dissected. The appendix was was skeletonized with the harmonic scalpel.   The appendix was divided at its base using an endo-GIA stapler. Minimal appendiceal stump was left in place. The appendix was removed from the abdomen with an Endocatch bag through the left subcostal port .  There was no evidence of bleeding, leakage, or complication after division of the appendix. Irrigation was also performed and irrigate suctioned from the abdomen as well.  The 5 mm trocars were removed.  The pneumoperitoneum was evacuated from the abdomen.    The trocar site skin wounds were closed with 4-0 Monocryl and dressed with Dermabond.  Instrument, sponge, and needle counts were correct at the conclusion of the case.   Findings: The appendix was found to be inflamed. There were not signs of necrosis.  There was not perforation. There was not abscess formation.  Estimated Blood Loss:  Minimal        Specimens: Appendix to pathology         Complications:  None; patient tolerated the procedure well.         Disposition: PACU - hemodynamically stable.         Condition: stable

## 2011-05-04 NOTE — Anesthesia Postprocedure Evaluation (Signed)
  Anesthesia Post-op Note  Patient: Gary [name]  Procedure(s) Performed: Procedure(s) (LRB): APPENDECTOMY LAPAROSCOPIC (N/A)  Patient Location: PACU  Anesthesia Type: General  Level of Consciousness: awake  Airway and Oxygen Therapy: Patient Spontanous Breathing  Post-op Pain: mild  Post-op Assessment: Post-op Vital signs reviewed, Patient's Cardiovascular Status Stable, Respiratory Function Stable, Patent Airway, No signs of Nausea or vomiting and Pain level controlled  Post-op Vital Signs: stable  Complications: No apparent anesthesia complications

## 2011-05-04 NOTE — ED Provider Notes (Signed)
History     CSN: [id number]  Arrival date & time 05/04/11  0700   First MD Initiated Contact with Patient 05/04/11 [phone]      Chief Complaint  Patient presents with  . Abdominal Pain    (Consider location/radiation/quality/duration/timing/severity/associated sxs/prior treatment) HPI History provided by pt.   Pt c/o severe, constant, diffuse, non-radiating abdominal pain since yesterday evening.  No relief w/ ibuprofen.  Associated w/ vomiting and diarrhea.  Denies fever, anorexia, hematemesis/hematochezia/melena, urinary sx, low back pain and testicular pain.  No known sick contacts.  No h/o kidney stones.  No h/o abd surgeries.  Has had similar pain in the past.  Per prior chart, seen for same on 03/19/11, had nml labs and acute abd series, diagnosed w/ viral gastroenteritis and d/c'd home w/ symptomatic tmnt.   History reviewed. No pertinent past medical history.  History reviewed. No pertinent past surgical history.  History reviewed. No pertinent family history.  History  Substance Use Topics  . Smoking status: Current Everyday Smoker -- 1.0 packs/day    Types: Cigarettes  . Smokeless tobacco: Not on file  . Alcohol Use: Yes      Review of Systems  All other systems reviewed and are negative.    Allergies  Review of patient's allergies indicates no known allergies.  Home Medications   Current Outpatient Rx  Name Route Sig Dispense Refill  . IBUPROFEN 200 MG PO TABS Oral Take 200-400 mg by mouth every 6 (six) hours as needed. pain      BP 130/83  Pulse 55  Temp(Src) 98.1 F (36.7 C) (Oral)  Resp 18  SpO2 98%  Physical Exam  Nursing note and vitals reviewed. Constitutional: He is oriented to person, place, and time. He appears well-developed and well-nourished. No distress.       Very uncomfortable appearing and restless.  Does not want to sit on the bed.  HENT:  Head: Normocephalic and atraumatic.  Eyes:       Normal appearance  Neck: Normal range of  motion.  Cardiovascular: Normal rate and regular rhythm.   Pulmonary/Chest: Effort normal and breath sounds normal.  Abdominal: Soft. Bowel sounds are normal. He exhibits no distension and no mass. There is no tenderness. There is no rebound and no guarding.       No CVA tenderness  Neurological: He is alert and oriented to person, place, and time.  Skin: Skin is warm and dry. No rash noted.  Psychiatric: He has a normal mood and affect. His behavior is normal.    ED Course  Procedures (including critical care time)  Labs Reviewed  CBC - Abnormal; Notable for the following:    MCHC 36.3 (*)    All other components within normal limits  DIFFERENTIAL - Abnormal; Notable for the following:    Neutrophils Relative 42 (*)    Monocytes Relative 13 (*)    All other components within normal limits  COMPREHENSIVE METABOLIC PANEL - Abnormal; Notable for the following:    Glucose, Bld 104 (*)    All other components within normal limits  URINALYSIS, ROUTINE W REFLEX MICROSCOPIC  LIPASE, BLOOD   Ct Abdomen Pelvis W Contrast  05/04/2011  *RADIOLOGY REPORT*  Clinical Data: Abdominal pain, nausea and vomiting  CT ABDOMEN AND PELVIS WITH CONTRAST  Technique:  Multidetector CT imaging of the abdomen and pelvis was performed following the standard protocol during bolus administration of intravenous contrast.  Contrast: OMNIPAQUE IOHEXOL 300 MG/ML  SOLN  Comparison: None.  Findings: Lung bases are unremarkable. No destructive bony lesions are noted.  Mild hepatic fatty infiltration.  No calcified gallstones are noted within gallbladder.  Pancreas, spleen and adrenal glands are unremarkable.  Enhanced kidneys are symmetrical in size.  No hydronephrosis or hydroureter. No small bowel obstruction.  No ascites or free air.  No adenopathy.  In axial image 63 there is a thickened distended appendix with mild surrounding inflammatory changes. The appendix measures at least 1.2 cm in diameter.  This is best  seen in the coronal image 28. Findings are consistent with early acute appendicitis.  There is no evidence of appendiceal perforation.  No appendiceal abscess.  No pelvic ascites or adenopathy.  Prostate gland seminal vesicles are unremarkable.  No destructive bony lesions are noted within pelvis.  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  There is about 7 mm anterolisthesis L5 on S1 vertebral body.  IMPRESSION:  1.In axial image 63 there is a thickened distended appendix with mild surrounding inflammatory changes. The appendix measures at least 1.2 cm in diameter.  This is best seen in the coronal image 28.  Findings are consistent with early acute appendicitis. 2.  No hydronephrosis or hydroureter. 3.  Mild hepatic fatty infiltration.  Critical findings discussed with Dr. Zebedee Iba from emergency room.  Original Report Authenticated By: Natasha Mead, M.D.     1. Acute appendicitis       MDM  Healthy 31yo M presents w/ c/o diffuse abd pain, N/V/D since yesterday.  On initial exam, pt appears very uncomfortable, afebrile, abd benign and completely non-tender.  Labs unremarkable.  Pt received 1mg  IV dilaudid and 4mg  zofran and sx greatly improved.  On repeat exam, mild tenderness in RLQ only.  Pt has not had pain isolated to this location, no fever, anorexia or leukocytosis; Appendicitis unlikely.  He requests another dose of pain medication.    Pain again improved but on re-examination, tenderness continues to be isolated to RLQ.  Pt states that same occurred w/ pain medication when treated for same in 03/2011.  Pt to be moved to CDU for CT abd/pelvis to r/o appendicitis.         Arie Sabina Bonaparte, [place] 05/04/11 1526

## 2011-05-04 NOTE — Transfer of Care (Signed)
Immediate Anesthesia Transfer of Care Note  Patient: Gary [name]  Procedure(s) Performed: Procedure(s) (LRB): APPENDECTOMY LAPAROSCOPIC (N/A)  Patient Location: PACU  Anesthesia Type: General  Level of Consciousness: oriented, sedated, patient cooperative and responds to stimulation  Airway & Oxygen Therapy: Patient Spontanous Breathing and Patient connected to face mask oxygen  Post-op Assessment: Report given to PACU RN, Post -op Vital signs reviewed and stable, Patient moving all extremities and Patient moving all extremities X 4  Post vital signs: Reviewed and stable  Complications: No apparent anesthesia complications

## 2011-05-04 NOTE — ED Provider Notes (Signed)
Medical screening examination/treatment/procedure(s) were conducted as a shared visit with non-physician practitioner(s) and myself.  I personally evaluated the patient during the encounter  Approximately 12 hours of constant, severe, diffuse abdominal pain. It is nonradiating. He has associated vomiting and diarrhea. Denies nausea, fever. No blood in his stool or emesis.  Diffuse abdominal pain on palpation. No rebound or guarding.  CT with acute appendicitis.  Placed on Trinda Pascal, MD 05/04/11 1534

## 2011-05-04 NOTE — Preoperative (Signed)
Beta Blockers   Reason not to administer Beta Blockers:Not Applicable 

## 2011-05-04 NOTE — ED Notes (Signed)
Pt moved from rm6 to cdu#11. Pt c/o of abd pain 10/10 and given to pt. Still needs urine for ua.

## 2011-05-04 NOTE — ED Notes (Signed)
Prior to giving morphine, pt called his mother to make sure he wasn't allergic to it and she told him it was all right.

## 2011-05-04 NOTE — Anesthesia Preprocedure Evaluation (Addendum)
Anesthesia Evaluation  Patient identified by MRN, date of birth, ID band Patient awake    Reviewed: Allergy & Precautions, H&P , NPO status , Patient's Chart, lab work & pertinent test results  Airway Mallampati: II TM Distance: >3 FB Neck ROM: Full    Dental  (+) Teeth Intact   Pulmonary Current Smoker,          Cardiovascular     Neuro/Psych    GI/Hepatic   Endo/Other    Renal/GU      Musculoskeletal   Abdominal (+) + obese,   Peds  Hematology   Anesthesia Other Findings   Reproductive/Obstetrics                          Anesthesia Physical Anesthesia Plan  ASA: II and Emergent  Anesthesia Plan: General   Post-op Pain Management:    Induction: Intravenous, Rapid sequence and Cricoid pressure planned  Airway Management Planned: Oral ETT  Additional Equipment:   Intra-op Plan:   Post-operative Plan: Extubation in OR  Informed Consent: I have reviewed the patients History and Physical, chart, labs and discussed the procedure including the risks, benefits and alternatives for the proposed anesthesia with the patient or authorized representative who has indicated his/her understanding and acceptance.     Plan Discussed with: CRNA and Surgeon  Anesthesia Plan Comments:        Anesthesia Quick Evaluation
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
## 2011-05-04 NOTE — Anesthesia Procedure Notes (Signed)
Procedure Name: Intubation Date/Time: 05/04/2011 4:38 PM Performed by: Wray Kearns A Pre-anesthesia Checklist: Patient identified, Timeout performed, Emergency Drugs available, Suction available and Patient being monitored Patient Re-evaluated:Patient Re-evaluated prior to inductionOxygen Delivery Method: Circle system utilized Preoxygenation: Pre-oxygenation with 100% oxygen Intubation Type: IV induction, Rapid sequence and Cricoid Pressure applied Ventilation: Mask ventilation without difficulty Laryngoscope Size: Mac and 4 Grade View: Grade I Tube type: Oral Tube size: 7.5 mm Number of attempts: 1 Airway Equipment and Method: Stylet and LTA kit utilized Placement Confirmation: ETT inserted through vocal cords under direct vision,  positive ETCO2,  CO2 detector and breath sounds checked- equal and bilateral Secured at: 23 cm Tube secured with: Tape Dental Injury: Teeth and Oropharynx as per pre-operative assessment

## 2011-05-04 NOTE — ED Notes (Signed)
To ed for eval of generalized abd pain. Last meal yesterday from Dione Plover. Normal BM. Smells of etoh

## 2011-05-05 LAB — GLUCOSE, CAPILLARY: Glucose-Capillary: 128 mg/dL — ABNORMAL HIGH (ref 70–99)

## 2011-05-05 MED ORDER — HYDROCODONE-ACETAMINOPHEN 5-325 MG PO TABS: 1.0000 | ORAL_TABLET | ORAL | Status: AC | PRN

## 2011-05-05 MED ORDER — PNEUMOCOCCAL VAC POLYVALENT 25 MCG/0.5ML IJ INJ: 0.5000 mL | INJECTION | INTRAMUSCULAR | Status: DC

## 2011-05-05 NOTE — Plan of Care (Signed)
Problem: Discharge Progression Outcomes Goal: Other Discharge Outcomes/Goals Outcome: Completed/Met Date Met:  05/05/11 Gave pt smoking cessation information. Wants to quit smoking himself.

## 2011-05-05 NOTE — Discharge Instructions (Signed)
[facility] ______[facility] °LAPAROSCOPIC SURGERY: POST OP INSTRUCTIONS °Always review your discharge instruction sheet given to you by the facility where your surgery was performed. °IF YOU HAVE DISABILITY OR FAMILY LEAVE FORMS, YOU MUST BRING THEM TO THE OFFICE FOR PROCESSING.   °DO NOT GIVE THEM TO YOUR DOCTOR. ° °1. A prescription for pain medication may be given to you upon discharge.  Take your pain medication as prescribed, if needed.  If narcotic pain medicine is not needed, then you may take acetaminophen (Tylenol) or ibuprofen (Advil) as needed. °2. Take your usually prescribed medications unless otherwise directed. °3. If you need a refill on your pain medication, please contact your pharmacy.  They will contact our office to request authorization. Prescriptions will not be filled after 5pm or on week-ends. °4. You should follow a light diet the first few days after arrival home, such as soup and crackers, etc.  Be sure to include lots of fluids daily. °5. Most patients will experience some swelling and bruising in the area of the incisions.  Ice packs will help.  Swelling and bruising can take several days to resolve.  °6. It is common to experience some constipation if taking pain medication after surgery.  Increasing fluid intake and taking a stool softener (such as Colace) will usually help or prevent this problem from occurring.  A mild laxative (Milk of Magnesia or Miralax) should be taken according to package instructions if there are no bowel movements after 48 hours. °7. Unless discharge instructions indicate otherwise, you may remove your bandages 24-48 hours after surgery, and you may shower at that time.  You may have steri-strips (small skin tapes) in place directly over the incision.  These strips should be left on the skin for 7-10 days.  If your surgeon used skin glue on the incision, you may shower in 24 hours.  The glue will flake off over the next 2-3 weeks.  Any sutures or  staples will be removed at the office during your follow-up visit. °8. ACTIVITIES:  You may resume regular (light) daily activities beginning the next day--such as daily self-care, walking, climbing stairs--gradually increasing activities as tolerated.  You may have sexual intercourse when it is comfortable.  Refrain from any heavy lifting or straining until approved by your doctor. °a. You may drive when you are no longer taking prescription pain medication, you can comfortably wear a seatbelt, and you can safely maneuver your car and apply brakes. °b. RETURN TO WORK:  __________________________________________________________ °9. You should see your doctor in the office for a follow-up appointment approximately 2-3 weeks after your surgery.  Make sure that you call for this appointment within a day or two after you arrive home to insure a convenient appointment time. °10. OTHER INSTRUCTIONS: __________________________________________________________________________________________________________________________ __________________________________________________________________________________________________________________________ °WHEN TO CALL YOUR DOCTOR: °1. Fever over 101.0 °2. Inability to urinate °3. Continued bleeding from incision. °4. Increased pain, redness, or drainage from the incision. °5. Increasing abdominal pain ° °The clinic staff is available to answer your questions during regular business hours.  Please don’t hesitate to call and ask to speak to one of the nurses for clinical concerns.  If you have a medical emergency, go to the nearest emergency room or call 911.  A surgeon from [facility] is always on call at the hospital. °[address], [address], [place], [place]  [address] ? [address] [address], [place], [place]   [address] °[phone] ? [phone] ? FAX [phone] °Web site:   [email or web address] °

## 2011-05-05 NOTE — Progress Notes (Signed)
[name] to be D/C'd Home per MD order.  Discussed with the patient and all questions fully answered.   [name], [name]  Home Medication Instructions [id number]   Printed on:05/05/11 0932  Medication Information                    ibuprofen (ADVIL,MOTRIN) 200 MG tablet Take 200-400 mg by mouth every 6 (six) hours as needed. pain           HYDROcodone-acetaminophen (NORCO) 5-325 MG per tablet Take 1-2 tablets by mouth every 4 (four) hours as needed for pain.             VVS, Skin clean, dry and intact without evidence of skin break down, no evidence of skin tears noted. IV catheter discontinued intact. Site without signs and symptoms of complications. Dressing and pressure applied.  An After Visit Summary was printed and given to the patient. Follow up appointments , new prescriptions and medication administration times given Patient escorted via WC, and D/C home via private auto.  Cindra Eves, RN 05/05/2011 9:32 AM

## 2011-05-05 NOTE — Progress Notes (Signed)
Patient ID: Gary [name], male   DOB: April 26, 1980, 31 y.o.   MRN: [id number] POD#1  Doing well Minimal pain Abdomen soft  Plan: discharge

## 2011-05-05 NOTE — Discharge Summary (Signed)
Physician Discharge Summary  Patient ID: Gary [name] MRN: [id number] DOB/AGE: 1981/01/20 31 y.o.  Admit date: 05/04/2011 Discharge date: 05/05/2011  Admission Diagnoses:  Acute appendicitis  Discharge Diagnoses: same Active Problems:  * No active hospital problems. *    Discharged Condition: good  Hospital Course: admitted for lap appy.  Uneventful post op course.  Did well.  Discharge POD#1  Consults: None  Significant Diagnostic Studies:   Treatments: surgery: lap appendectomy  Discharge Exam: Blood pressure 113/67, pulse 48, temperature 98.1 F (36.7 C), temperature source Oral, resp. rate 17, height 5\' 8"  (1.727 m), weight 197 lb 15.6 oz (89.8 kg), SpO2 99.00%. General appearance: alert and no distress Resp: clear to auscultation bilaterally Incision/Wound:  Abdomen soft, incisions clean  Disposition: 01-Home or Self Care   Medication List  As of 05/05/2011  8:39 AM   TAKE these medications         HYDROcodone-acetaminophen 5-325 MG per tablet   Commonly known as: NORCO   Take 1-2 tablets by mouth every 4 (four) hours as needed for pain.      ibuprofen 200 MG tablet   Commonly known as: ADVIL,MOTRIN   Take 200-400 mg by mouth every 6 (six) hours as needed. pain           Follow-up Information    Follow up with [facility], [place]. Call in 2 weeks. [phone])    Contact information:   [facility] [address] [place] [address] [phone]          Signed: Shelly Rubenstein 05/05/2011, 8:39 AM

## 2011-05-06 ENCOUNTER — Encounter (HOSPITAL_COMMUNITY): Payer: Self-pay | Admitting: General Surgery

## 2011-09-12 IMAGING — CR DG KNEE COMPLETE 4+V*R*
4 series · 4 of 4 positions shown · non-contrast
Comparison: None.

CLINICAL DATA: History of injury from fall.  Right knee pain.  Pain
in the lateral aspect inferior to patella.  Inability to straighten
knee.

RIGHT KNEE - COMPLETE 4+ VIEW

[t knee ap right]
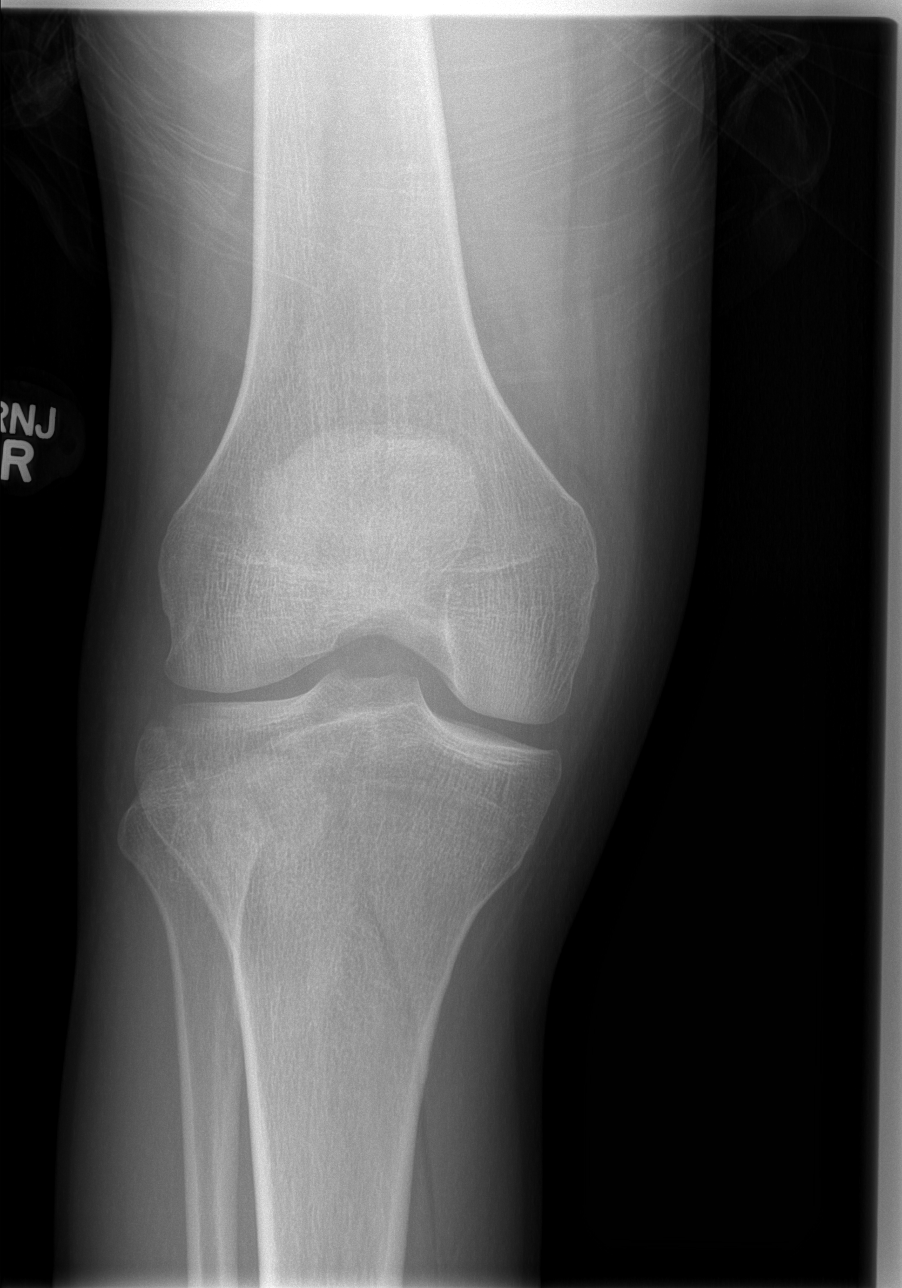

[t knee oblique right (1 of 2)]
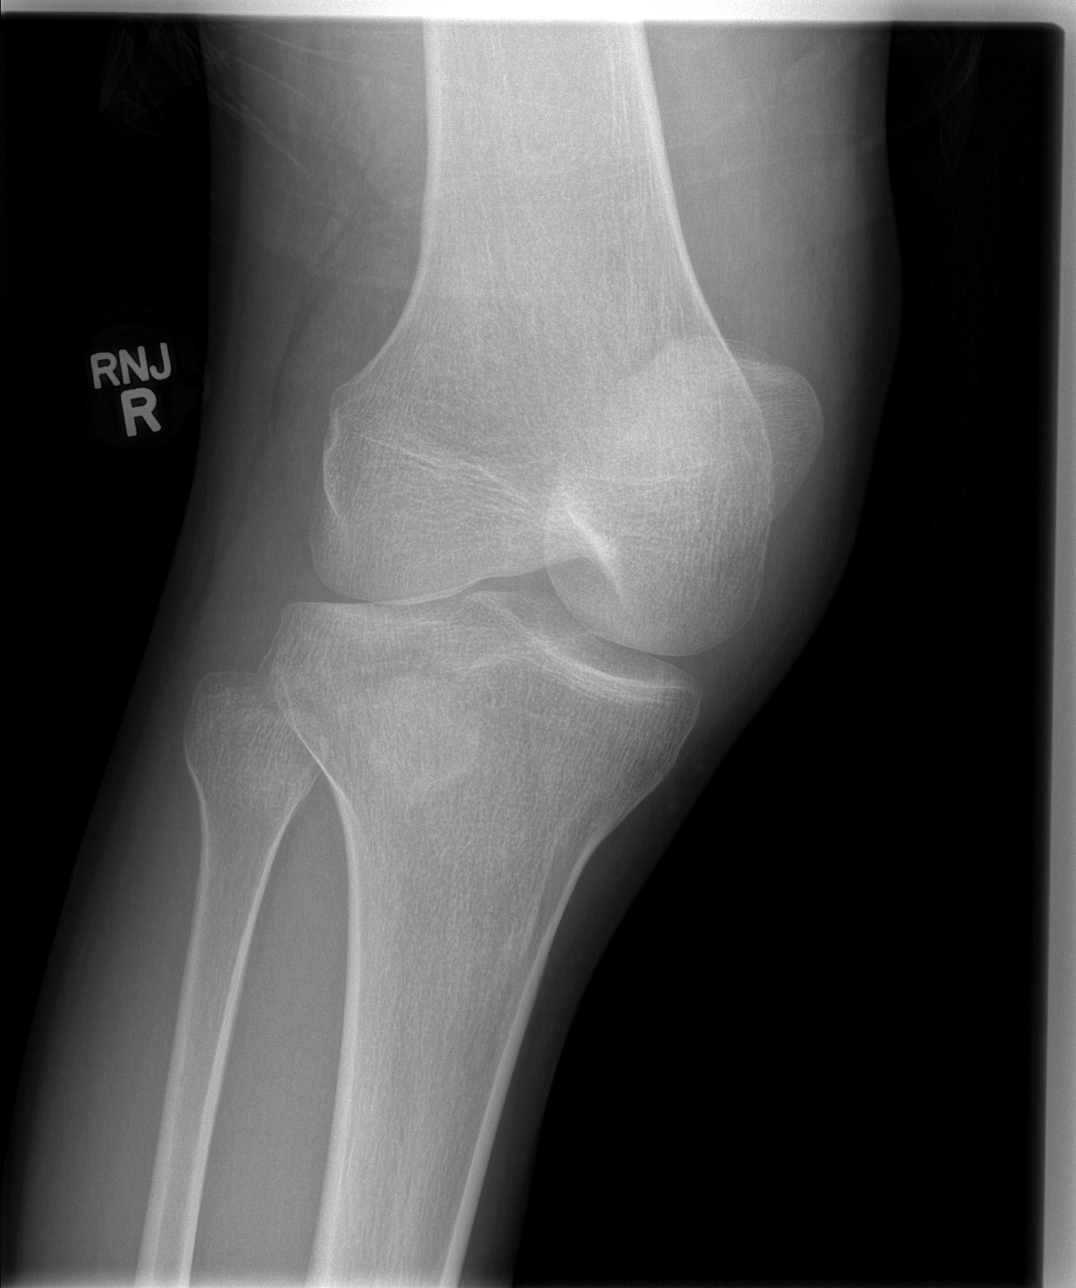

[t knee oblique right (2 of 2)]
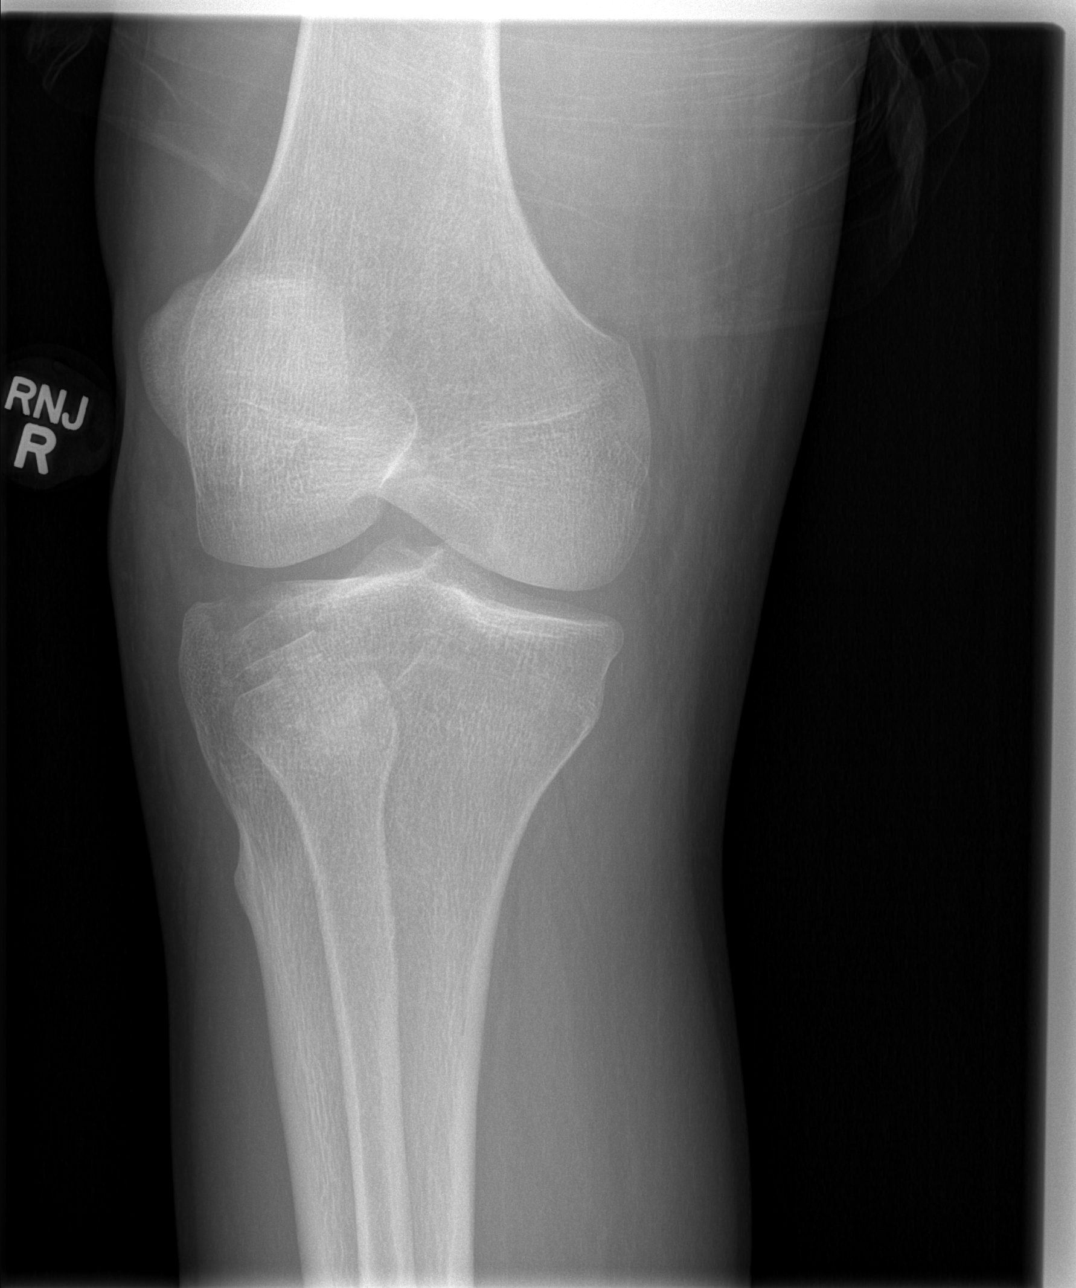

[t knee lat right]
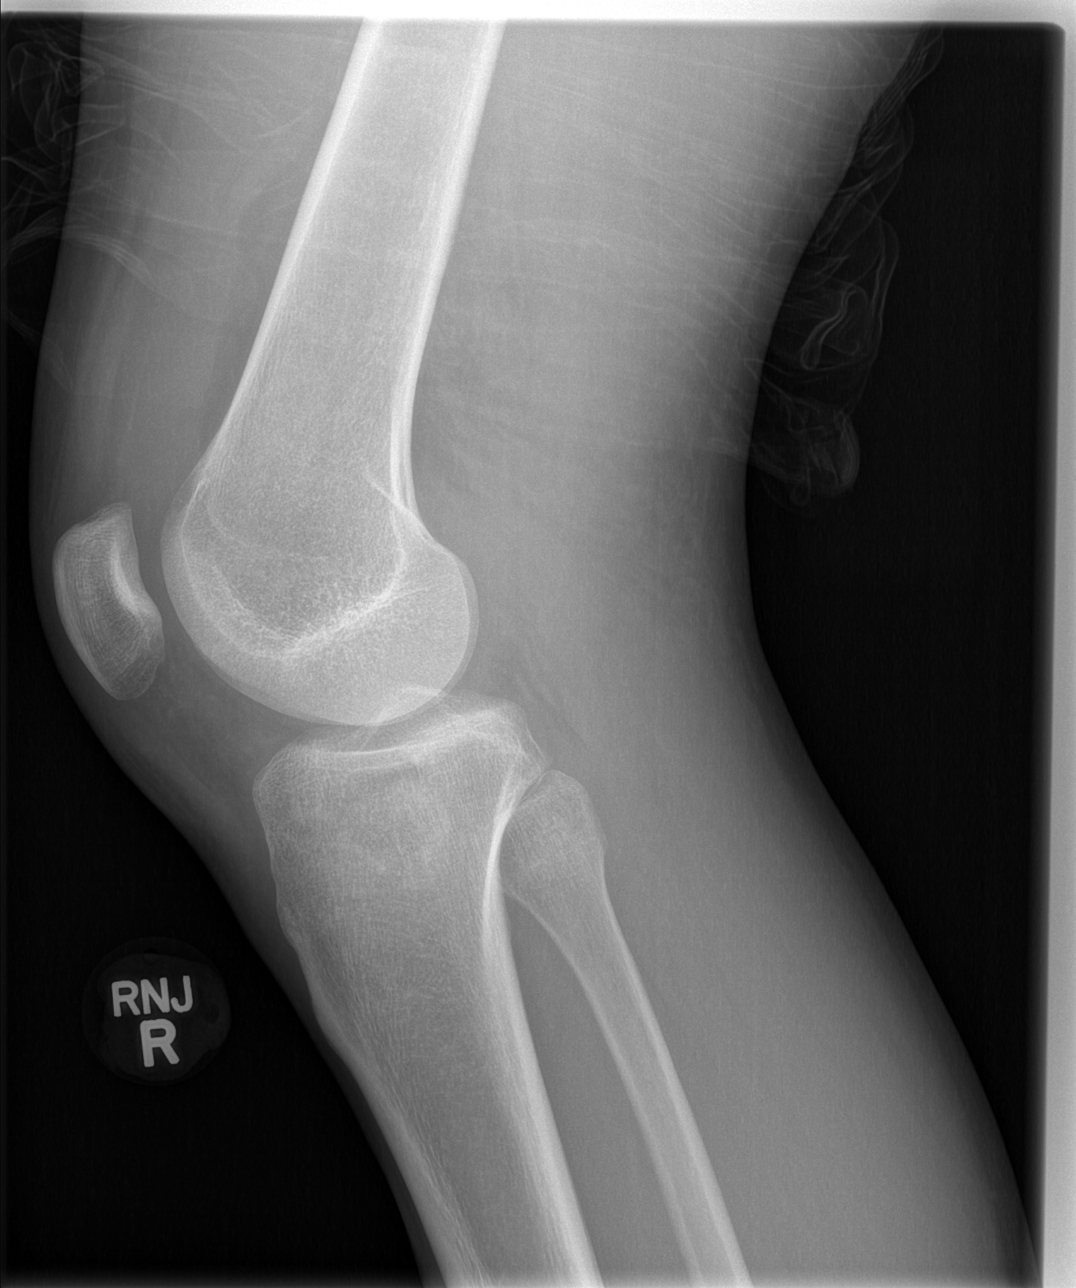

[4 of 4 positions shown; findings below may reference images not displayed]

FINDINGS: No definite joint effusion is evident. There is cortical
interruption of the lateral aspect of the lateral tibial plateau
consistent with fracture.  There is slight depression of bone of
approximately 2 mm.  No comminution, dislocation, or angulation is
evident.  No other fracture is identified.
IMPRESSION: Fracture of the lateral tibial plateau.

## 2013-08-04 ENCOUNTER — Emergency Department (HOSPITAL_COMMUNITY)
Admission: EM | Admit: 2013-08-04 | Discharge: 2013-08-05 | Disposition: A | Discharge status: Home / Self Care | Payer: Self-pay | Attending: Emergency Medicine | Admitting: Emergency Medicine

## 2013-08-04 ENCOUNTER — Encounter (HOSPITAL_COMMUNITY): Payer: Self-pay | Admitting: Emergency Medicine

## 2013-08-04 DIAGNOSIS — IMO0001 Reserved for inherently not codable concepts without codable children: Secondary | ICD-10-CM | POA: Insufficient documentation

## 2013-08-04 DIAGNOSIS — J029 Acute pharyngitis, unspecified: Secondary | ICD-10-CM | POA: Insufficient documentation

## 2013-08-04 DIAGNOSIS — K029 Dental caries, unspecified: Secondary | ICD-10-CM | POA: Insufficient documentation

## 2013-08-04 DIAGNOSIS — F17200 Nicotine dependence, unspecified, uncomplicated: Secondary | ICD-10-CM | POA: Insufficient documentation

## 2013-08-04 MED ORDER — SODIUM CHLORIDE 0.9 % IV BOLUS (SEPSIS)
1000.0000 mL | Freq: Once | INTRAVENOUS | Status: AC
  Administered 2013-08-04: 1000 mL via INTRAVENOUS

## 2013-08-04 MED ORDER — ACETAMINOPHEN 325 MG PO TABS
650.0000 mg | ORAL_TABLET | Freq: Once | ORAL | Status: AC
  Administered 2013-08-04: 650 mg via ORAL
  Filled 2013-08-04: qty 2

## 2013-08-04 NOTE — ED Notes (Signed)
EMS called to home.  Patient is alert and oriented x3.  He is complaining of a fever.   EMS states temp was 102 on arrival.  Patient adds that he has been feeling bad all day With a  Fever, chills and body aches

## 2013-08-04 NOTE — ED Notes (Signed)
Bed: [id number] Expected date: 08/04/13 Expected time: 11:13 PM Means of arrival: Ambulance Comments: EMS  Sore throat, fever

## 2013-08-05 LAB — CBC WITH DIFFERENTIAL/PLATELET
BASOS ABS: 0 10*3/uL (ref 0.0–0.1)
Basophils Relative: 0 % (ref 0–1)
EOS ABS: 0.1 10*3/uL (ref 0.0–0.7)
Eosinophils Relative: 1 % (ref 0–5)
HCT: 39.2 % (ref 39.0–52.0)
Hemoglobin: 14.1 g/dL (ref 13.0–17.0)
LYMPHS PCT: 15 % (ref 12–46)
Lymphs Abs: 1.5 10*3/uL (ref 0.7–4.0)
MCH: 32.2 pg (ref 26.0–34.0)
MCHC: 36 g/dL (ref 30.0–36.0)
MCV: 89.5 fL (ref 78.0–100.0)
MONOS PCT: 16 % — AB (ref 3–12)
Monocytes Absolute: 1.6 10*3/uL — ABNORMAL HIGH (ref 0.1–1.0)
NEUTROS ABS: 6.7 10*3/uL (ref 1.7–7.7)
NEUTROS PCT: 68 % (ref 43–77)
PLATELETS: 224 10*3/uL (ref 150–400)
RBC: 4.38 MIL/uL (ref 4.22–5.81)
RDW: 13.1 % (ref 11.5–15.5)
WBC: 9.9 10*3/uL (ref 4.0–10.5)

## 2013-08-05 LAB — COMPREHENSIVE METABOLIC PANEL
ALBUMIN: 4.1 g/dL (ref 3.5–5.2)
ALK PHOS: 78 U/L (ref 39–117)
ALT: 17 U/L (ref 0–53)
ANION GAP: 16 — AB (ref 5–15)
AST: 22 U/L (ref 0–37)
BUN: 9 mg/dL (ref 6–23)
CO2: 23 mEq/L (ref 19–32)
Calcium: 9.3 mg/dL (ref 8.4–10.5)
Chloride: 97 mEq/L (ref 96–112)
Creatinine, Ser: 1.28 mg/dL (ref 0.50–1.35)
GFR calc Af Amer: 83 mL/min — ABNORMAL LOW (ref >90)
GFR calc non Af Amer: 72 mL/min — ABNORMAL LOW (ref >90)
Glucose, Bld: 99 mg/dL (ref 70–99)
POTASSIUM: 3.4 meq/L — AB (ref 3.7–5.3)
Sodium: 136 mEq/L — ABNORMAL LOW (ref 137–147)
TOTAL PROTEIN: 7.6 g/dL (ref 6.0–8.3)
Total Bilirubin: 0.6 mg/dL (ref 0.3–1.2)

## 2013-08-05 LAB — RAPID STREP SCREEN (MED CTR MEBANE ONLY): STREPTOCOCCUS, GROUP A SCREEN (DIRECT): NEGATIVE

## 2013-08-05 MED ORDER — DEXAMETHASONE 6 MG PO TABS
6.0000 mg | ORAL_TABLET | Freq: Once | ORAL | Status: AC
  Administered 2013-08-05: 6 mg via ORAL
  Filled 2013-08-05 (×2): qty 1

## 2013-08-05 NOTE — ED Provider Notes (Signed)
CSN: [id number]     Arrival date & time 08/04/13  2319 History   First MD Initiated Contact with Patient 08/05/13 0016     Chief Complaint  Patient presents with  . Fever     (Consider location/radiation/quality/duration/timing/severity/associated sxs/prior Treatment) Patient is a 33 y.o. male presenting with fever. The history is provided by the patient and the spouse. No language interpreter was used.  Fever Max temp prior to arrival:  102 Temp source:  Oral Duration:  1 hour Timing:  Constant Progression:  Waxing and waning Chronicity:  New Relieved by:  Nothing Worsened by:  Nothing tried Ineffective treatments:  None tried Associated symptoms: chills, myalgias and sore throat   Associated symptoms: no chest pain, no confusion, no congestion, no cough, no diarrhea, no dysuria, no headaches, no nausea, no rash, no rhinorrhea and no vomiting   Myalgias:    Location:  Generalized   Quality:  Aching   Severity:  Moderate   Duration:  1 day   Timing:  Intermittent   Progression:  Waxing and waning Sore throat:    Severity:  Moderate   Onset quality:  Unable to specify   Duration:  1 day   Timing:  Constant   Progression:  Worsening Risk factors: no hx of cancer, no immunosuppression, no occupational exposure, no recent surgery, no recent travel and no sick contacts     History reviewed. No pertinent past medical history. Past Surgical History  Procedure Laterality Date  . Laparoscopic appendectomy  05/04/2011    Procedure: APPENDECTOMY LAPAROSCOPIC;  Surgeon: Almond LintFaera Byerly, MD;  Location: [facility] OR;  Service: General;  Laterality: N/A;   History reviewed. No pertinent family history. History  Substance Use Topics  . Smoking status: Current Every Day Smoker -- 1.00 packs/day    Types: Cigarettes  . Smokeless tobacco: Not on file  . Alcohol Use: Yes    Review of Systems  Constitutional: Positive for fever and chills. Negative for activity change, appetite change and  fatigue.  HENT: Positive for sore throat. Negative for congestion, facial swelling, rhinorrhea and trouble swallowing.   Eyes: Negative for photophobia and pain.  Respiratory: Negative for cough, chest tightness and shortness of breath.   Cardiovascular: Negative for chest pain and leg swelling.  Gastrointestinal: Negative for nausea, vomiting, abdominal pain, diarrhea and constipation.  Endocrine: Negative for polydipsia and polyuria.  Genitourinary: Negative for dysuria, urgency, decreased urine volume and difficulty urinating.  Musculoskeletal: Positive for myalgias. Negative for back pain and gait problem.  Skin: Negative for color change, rash and wound.  Allergic/Immunologic: Negative for immunocompromised state.  Neurological: Negative for dizziness, facial asymmetry, speech difficulty, weakness, numbness and headaches.  Psychiatric/Behavioral: Negative for confusion, decreased concentration and agitation.      Allergies  Review of patient's allergies indicates no known allergies.  Home Medications   Prior to Admission medications   Not on File   BP 134/74  Pulse 98  Temp(Src) 101.9 F (38.8 C) (Oral)  Resp 20  Ht 5\' 8"  (1.727 m)  SpO2 100% Physical Exam  Constitutional: He is oriented to person, place, and time. He appears well-developed and well-nourished. No distress.  HENT:  Head: Normocephalic and atraumatic.  Mouth/Throat: Dental caries present. Posterior oropharyngeal erythema present. No oropharyngeal exudate, posterior oropharyngeal edema or tonsillar abscesses.  Poor dentition. No pain w/ percussion to teeth.   Eyes: Pupils are equal, round, and reactive to light.  Neck: Normal range of motion. Neck supple. JVD: anterior.  Cardiovascular: Normal rate,  regular rhythm and normal heart sounds.  Exam reveals no gallop and no friction rub.   No murmur heard. Pulmonary/Chest: Effort normal and breath sounds normal. No respiratory distress. He has no wheezes. He  has no rales.  Abdominal: Soft. Bowel sounds are normal. He exhibits no distension and no mass. There is no tenderness. There is no rebound and no guarding.  Musculoskeletal: Normal range of motion. He exhibits no edema and no tenderness.  Lymphadenopathy:    He has cervical adenopathy.  Neurological: He is alert and oriented to person, place, and time.  Skin: Skin is warm and dry.  Psychiatric: He has a normal mood and affect.    ED Course  Procedures (including critical care time) Labs Review Labs Reviewed  CBC WITH DIFFERENTIAL - Abnormal; Notable for the following:    Monocytes Relative 16 (*)    Monocytes Absolute 1.6 (*)    All other components within normal limits  COMPREHENSIVE METABOLIC PANEL - Abnormal; Notable for the following:    Sodium 136 (*)    Potassium 3.4 (*)    GFR calc non Af Amer 72 (*)    GFR calc Af Amer 83 (*)    Anion gap 16 (*)    All other components within normal limits  RAPID STREP SCREEN  CULTURE, GROUP A STREP    Imaging Review No results found.   EKG Interpretation None      MDM   Final diagnoses:  Viral pharyngitis    Pt is a 33 y.o. male with Pmhx as above who presents with less than 1 day of fever, chills, mylagias, sore throat. No cough, trouble breathing, n/v, d/a or ab pain. He has taken no meds at home. On PE Pt febrile, but in NAD. +tender anterior cervical lymphadenopathy. Mild BL tonsilar enlargement w/o sings of PTA or RPA, no exudates. Rapid strep negative. Pt given tylenol, IVF, and decadron for pain. Will d/c home w/ rec for continued supportive care w/ PO hydration, and antipyretics.         Shanna Cisco, MD 08/05/13 [phone]

## 2013-08-05 NOTE — ED Notes (Signed)
Patient is alert and oriented x3.  He was given DC instructions and follow up visit instructions.  Patient gave verbal understanding.  He was DC ambulatory under his own power to home.  V/S stable.  He was not showing any signs of distress on DC 

## 2013-08-05 NOTE — Discharge Instructions (Signed)
Viral Pharyngitis  Viral pharyngitis is a viral infection that produces redness, pain, and swelling (inflammation) of the throat. It can spread from person to person (contagious).   CAUSES  Viral pharyngitis is caused by inhaling a large amount of certain germs called viruses. Many different viruses cause viral pharyngitis.  SYMPTOMS  Symptoms of viral pharyngitis include:  · Sore throat.  · Tiredness.  · Stuffy nose.  · Low-grade fever.  · Congestion.  · Cough.  TREATMENT  Treatment includes rest, drinking plenty of fluids, and the use of over-the-counter medication (approved by your caregiver).  HOME CARE INSTRUCTIONS   · Drink enough fluids to keep your urine clear or pale yellow.  · Eat soft, cold foods such as ice cream, frozen ice pops, or gelatin dessert.  · Gargle with warm salt water (1 tsp salt per 1 qt of water).  · If over age 7, throat lozenges may be used safely.  · Only take over-the-counter or prescription medicines for pain, discomfort, or fever as directed by your caregiver. Do not take aspirin.  To help prevent spreading viral pharyngitis to others, avoid:  · Mouth-to-mouth contact with others.  · Sharing utensils for eating and drinking.  · Coughing around others.  SEEK MEDICAL CARE IF:   · You are better in a few days, then become worse.  · You have a fever or pain not helped by pain medicines.  · There are any other changes that concern you.  Document Released: 10/24/2004 Document Revised: 04/08/2011 Document Reviewed: 03/22/2010  ExitCare® Patient Information ©2015 [employer]. This information is not intended to replace advice given to you by your health care provider. Make sure you discuss any questions you have with your health care provider.

## 2013-08-07 LAB — CULTURE, GROUP A STREP

## 2013-08-10 ENCOUNTER — Emergency Department (HOSPITAL_COMMUNITY): Payer: Self-pay

## 2013-08-10 ENCOUNTER — Encounter (HOSPITAL_COMMUNITY): Payer: Self-pay | Admitting: Emergency Medicine

## 2013-08-10 ENCOUNTER — Emergency Department (HOSPITAL_COMMUNITY)
Admission: EM | Admit: 2013-08-10 | Discharge: 2013-08-10 | Disposition: A | Discharge status: Home / Self Care | Payer: Self-pay | Attending: Emergency Medicine | Admitting: Emergency Medicine

## 2013-08-10 DIAGNOSIS — J36 Peritonsillar abscess: Secondary | ICD-10-CM | POA: Insufficient documentation

## 2013-08-10 DIAGNOSIS — F17200 Nicotine dependence, unspecified, uncomplicated: Secondary | ICD-10-CM | POA: Insufficient documentation

## 2013-08-10 DIAGNOSIS — K089 Disorder of teeth and supporting structures, unspecified: Secondary | ICD-10-CM | POA: Insufficient documentation

## 2013-08-10 LAB — CBC WITH DIFFERENTIAL/PLATELET
BASOS PCT: 0 % (ref 0–1)
Basophils Absolute: 0 10*3/uL (ref 0.0–0.1)
EOS ABS: 0.1 10*3/uL (ref 0.0–0.7)
Eosinophils Relative: 1 % (ref 0–5)
HCT: 38.5 % — ABNORMAL LOW (ref 39.0–52.0)
Hemoglobin: 13.9 g/dL (ref 13.0–17.0)
Lymphocytes Relative: 21 % (ref 12–46)
Lymphs Abs: 2.1 10*3/uL (ref 0.7–4.0)
MCH: 32 pg (ref 26.0–34.0)
MCHC: 36.1 g/dL — AB (ref 30.0–36.0)
MCV: 88.7 fL (ref 78.0–100.0)
Monocytes Absolute: 1 10*3/uL (ref 0.1–1.0)
Monocytes Relative: 10 % (ref 3–12)
NEUTROS PCT: 68 % (ref 43–77)
Neutro Abs: 6.9 10*3/uL (ref 1.7–7.7)
PLATELETS: 295 10*3/uL (ref 150–400)
RBC: 4.34 MIL/uL (ref 4.22–5.81)
RDW: 12.9 % (ref 11.5–15.5)
WBC: 10.2 10*3/uL (ref 4.0–10.5)

## 2013-08-10 LAB — MONONUCLEOSIS SCREEN: MONO SCREEN: NEGATIVE

## 2013-08-10 LAB — RAPID STREP SCREEN (MED CTR MEBANE ONLY): STREPTOCOCCUS, GROUP A SCREEN (DIRECT): NEGATIVE

## 2013-08-10 MED ORDER — OXYCODONE-ACETAMINOPHEN 5-325 MG PO TABS: 1.0000 | ORAL_TABLET | ORAL | Status: DC | PRN

## 2013-08-10 MED ORDER — CLINDAMYCIN HCL 300 MG PO CAPS
300.0000 mg | ORAL_CAPSULE | Freq: Once | ORAL | Status: AC
  Administered 2013-08-10: 300 mg via ORAL
  Filled 2013-08-10: qty 1

## 2013-08-10 MED ORDER — OXYCODONE-ACETAMINOPHEN 5-325 MG PO TABS
1.0000 | ORAL_TABLET | Freq: Once | ORAL | Status: AC
  Administered 2013-08-10: 1 via ORAL
  Filled 2013-08-10: qty 1

## 2013-08-10 MED ORDER — IOHEXOL 300 MG/ML  SOLN
100.0000 mL | Freq: Once | INTRAMUSCULAR | Status: AC | PRN
  Administered 2013-08-10: 100 mL via INTRAVENOUS

## 2013-08-10 MED ORDER — OXYCODONE-ACETAMINOPHEN 5-325 MG PO TABS
2.0000 | ORAL_TABLET | Freq: Once | ORAL | Status: AC
  Administered 2013-08-10: 2 via ORAL
  Filled 2013-08-10: qty 2

## 2013-08-10 MED ORDER — CLINDAMYCIN HCL 300 MG PO CAPS: 300.0000 mg | ORAL_CAPSULE | Freq: Four times a day (QID) | ORAL | Status: DC

## 2013-08-10 NOTE — ED Provider Notes (Signed)
CSN: [id number]     Arrival date & time 08/10/13  1103 History   First MD Initiated Contact with Patient 08/10/13 1131     Chief Complaint  Patient presents with  . Sore Throat    pt reports persistent sthoat pain  . Dental Pain    r/lower jaw pain and swelling  . Oral Swelling     (Consider location/radiation/quality/duration/timing/severity/associated sxs/prior Treatment) HPI  33 year old male presents complaining of sore throat. Patient states for the more than a week he has been having severe pain to his throat and neck. Describe pain as a sharp and throbbing sensation alternating between his left side of neck and right side of neck. States throat is very sore to swallow, difficulty talking and with neck movement. Pain is a 10 out of 10, unrelieved with taking ibuprofen and gargle with saltwater. Initially endorse fever but now only endorsed chills. No complaints of runny nose sneezing or coughing, no chest pain or shortness of breath. Denies any rash. Patient was seen a week ago for this complaint. Strep test at that time was negative. Patient was sent home with supportive care however that has not helped. No prior history of peritonsillar abscess.  History reviewed. No pertinent past medical history. Past Surgical History  Procedure Laterality Date  . Laparoscopic appendectomy  05/04/2011    Procedure: APPENDECTOMY LAPAROSCOPIC;  Surgeon: Almond LintFaera Byerly, MD;  Location: [facility] OR;  Service: General;  Laterality: N/A;   History reviewed. No pertinent family history. History  Substance Use Topics  . Smoking status: Current Every Day Smoker -- 1.00 packs/day    Types: Cigarettes  . Smokeless tobacco: Not on file  . Alcohol Use: Yes    Review of Systems  All other systems reviewed and are negative.     Allergies  Review of patient's allergies indicates no known allergies.  Home Medications   Prior to Admission medications   Medication Sig Start Date End Date Taking? Authorizing  Provider  ibuprofen (ADVIL,MOTRIN) 200 MG tablet Take 400 mg by mouth every 6 (six) hours as needed for moderate pain.   Yes Historical Provider, MD   BP 129/88  Pulse 90  Temp(Src) 98.9 F (37.2 C) (Oral)  Resp 20  SpO2 99% Physical Exam  Constitutional: He is oriented to person, place, and time. He appears well-developed and well-nourished. No distress (Patient is tearful, appears to be in pain however nontoxic in appearance).  HENT:  Head: Atraumatic.  Ears: cerumen impaction unable to fully visualize TMs bilaterally Nose: normal nares Throat: uvula midline, mild tonsilar enlargement without exudates bilaterally.  postoropharyngeal erythema but without obvious PTA, or RPA.    Eyes: Conjunctivae are normal.  Neck: Normal range of motion. Neck supple.  No mengingismal sign  Abdominal: There is no tenderness.  No splenomegaly  Lymphadenopathy:    He has cervical adenopathy (bilateral anterior shotty lymphadenopathy without rash.  ).  Neurological: He is alert and oriented to person, place, and time.  Skin: No rash noted.  Psychiatric: He has a normal mood and affect.    ED Course  Procedures (including critical care time)  12:08 PM Pt with 1 week progressive worsening sore throat with lymphadenopathy.  Was see 1 week ago with neg strep.  Although pt without obvious signs of PTA or RPA, given his significant discomfort will obtain neck CT t/o deep space infection.  Pain medication given.    2:29 PM Neck CT scan demonstrated left peritonsillar abscess. There is mild mass effect upon the  oropharyngeal airway. No other acute finding noted. Patient is currently afebrile, able to tolerates by mouth, normal WBC, strep and mono test are unremarkable. I have consult with ENT specialists and spoke to Dr. Jenne Pane assistant who notified that Dr. Jenne Pane will see patient in the ER for I&D. Patient is made aware of plan. Care discussed with Dr. Freida Busman.    5:41 PM Pt was seen by Dr. Jenne Pane, who  performed an I&D.  Pt tolerates secretion, stable for discharge with close f/u.  Dr. Effie Shy has also seen and evaluate pt.    Labs Review Labs Reviewed  CBC WITH DIFFERENTIAL - Abnormal; Notable for the following:    HCT 38.5 (*)    MCHC 36.1 (*)    All other components within normal limits  RAPID STREP SCREEN  CULTURE, GROUP A STREP  MONONUCLEOSIS SCREEN  I-STAT CHEM 8, ED    Imaging Review Ct Soft Tissue Neck W Contrast  08/10/2013   CLINICAL DATA:  Sore throat and dysphagia for 1 week  EXAM: CT NECK WITH CONTRAST  TECHNIQUE: Multidetector CT imaging of the neck was performed using the standard protocol following the bolus administration of intravenous contrast.  CONTRAST:  OMNIPAQUE IOHEXOL 300 MG/ML  SOLN  COMPARISON:  None.  FINDINGS: There is a focus of abnormal hypodensity on the left in the deep tonsillar soft tissues which measures 1.3 x 1.5 x 1.6 cm. It is appreciated best on images 28 through 36. There is mild shift of the oropharyngeal airway towards the right. There are no abnormalities in the right tonsillar soft tissues.  The prevertebral soft tissue spaces are normal. No abnormal soft tissue gas collections are demonstrated. The observed paranasal sinuses and mastoid air cells are clear. The nasal passages are patent. The parotid and submandibular glands are normal and tongue base are normal. There are few mildly enlarged lymph nodes at the level of the angle of the mandible. The jugular and carotid vessels are normal. The laryngeal structures and the thyroid gland are normal.  There is mild loss of the normal cervical lordosis which may reflect muscle spasm. No acute bony abnormality of the cervical spine is demonstrated. The pulmonary apices are clear.  IMPRESSION: 1. Left peritonsillar abscess. There is mild mass effect upon the oropharyngeal airway. 2. No acute abnormality elsewhere within the neck is demonstrated. 3. These results were called by telephone at the time of  interpretation on 08/10/2013 at 1:53 pm to Dr. Rubin Payor who verbally acknowledged these results.   Electronically Signed   By: David  [place]   On: 08/10/2013 13:51     EKG Interpretation None      MDM   Final diagnoses:  Peritonsillar abscess    BP 127/77  Pulse 77  Temp(Src) 98.9 F (37.2 C) (Oral)  Resp 20  SpO2 100%  I have reviewed nursing notes and vital signs. I personally reviewed the imaging tests through PACS system  I reviewed available ER/hospitalization records thought the EMR     Fayrene Helper, [place] 08/10/13 1742

## 2013-08-10 NOTE — ED Notes (Signed)
Pt rocking back in fourth in chair

## 2013-08-10 NOTE — Consult Note (Signed)
Reason for Consult:peritonsillar abscess Referring Physician: ER  Gary [name] is an 33 y.o. male.  HPI: 33 year old male presented to the ER one week ago with throat pain that started on the left and then involved both sides.  Rapid strep was negative and he was sent home on symptomatic therapy.  However, symptoms did not improve and have involved mostly the left side with pain and swelling.  Pain is worse with swallowing and is stabbing and severe.  Pain radiates to the left ear.  He returned to the ER today and a CT demonstrates a left peritonsillar abscess.  History reviewed. No pertinent past medical history.  Past Surgical History  Procedure Laterality Date  . Laparoscopic appendectomy  05/04/2011    Procedure: APPENDECTOMY LAPAROSCOPIC;  Surgeon: Almond Lint, MD;  Location: [facility] OR;  Service: General;  Laterality: N/A;    History reviewed. No pertinent family history.  Social History:  reports that he has been smoking Cigarettes.  He has been smoking about 1.00 pack per day. He does not have any smokeless tobacco history on file. He reports that he drinks alcohol. His drug history is not on file.  Allergies: No Known Allergies  Medications: I have reviewed the patient's current medications.  Results for orders placed during the hospital encounter of 08/10/13 (from the past 48 hour(s))  RAPID STREP SCREEN     Status: None   Collection Time    08/10/13 12:09 PM      Result Value Ref Range   Streptococcus, Group A Screen (Direct) NEGATIVE  NEGATIVE   Comment: (NOTE)     A Rapid Antigen test may result negative if the antigen level in the     sample is below the detection level of this test. The FDA has not     cleared this test as a stand-alone test therefore the rapid antigen     negative result has reflexed to a Group A Strep culture.  CBC WITH DIFFERENTIAL     Status: Abnormal   Collection Time    08/10/13 12:12 PM      Result Value Ref Range   WBC 10.2  4.0 - 10.5 K/uL    RBC 4.34  4.22 - 5.81 MIL/uL   Hemoglobin 13.9  13.0 - 17.0 g/dL   HCT 16.1 (*) 09.6 - 04.5 %   MCV 88.7  78.0 - 100.0 fL   MCH 32.0  26.0 - 34.0 pg   MCHC 36.1 (*) 30.0 - 36.0 g/dL   RDW 40.9  81.1 - 91.4 %   Platelets 295  150 - 400 K/uL   Neutrophils Relative % 68  43 - 77 %   Neutro Abs 6.9  1.7 - 7.7 K/uL   Lymphocytes Relative 21  12 - 46 %   Lymphs Abs 2.1  0.7 - 4.0 K/uL   Monocytes Relative 10  3 - 12 %   Monocytes Absolute 1.0  0.1 - 1.0 K/uL   Eosinophils Relative 1  0 - 5 %   Eosinophils Absolute 0.1  0.0 - 0.7 K/uL   Basophils Relative 0  0 - 1 %   Basophils Absolute 0.0  0.0 - 0.1 K/uL  MONONUCLEOSIS SCREEN     Status: None   Collection Time    08/10/13 12:12 PM      Result Value Ref Range   Mono Screen NEGATIVE  NEGATIVE    Ct Soft Tissue Neck W Contrast  08/10/2013   CLINICAL DATA:  Sore  throat and dysphagia for 1 week  EXAM: CT NECK WITH CONTRAST  TECHNIQUE: Multidetector CT imaging of the neck was performed using the standard protocol following the bolus administration of intravenous contrast.  CONTRAST:  100mL OMNIPAQUE IOHEXOL 300 MG/ML  SOLN  COMPARISON:  None.  FINDINGS: There is a focus of abnormal hypodensity on the left in the deep tonsillar soft tissues which measures 1.3 x 1.5 x 1.6 cm. It is appreciated best on images 28 through 36. There is mild shift of the oropharyngeal airway towards the right. There are no abnormalities in the right tonsillar soft tissues.  The prevertebral soft tissue spaces are normal. No abnormal soft tissue gas collections are demonstrated. The observed paranasal sinuses and mastoid air cells are clear. The nasal passages are patent. The parotid and submandibular glands are normal and tongue base are normal. There are few mildly enlarged lymph nodes at the level of the angle of the mandible. The jugular and carotid vessels are normal. The laryngeal structures and the thyroid gland are normal.  There is mild loss of the normal cervical  lordosis which may reflect muscle spasm. No acute bony abnormality of the cervical spine is demonstrated. The pulmonary apices are clear.  IMPRESSION: 1. Left peritonsillar abscess. There is mild mass effect upon the oropharyngeal airway. 2. No acute abnormality elsewhere within the neck is demonstrated. 3. These results were called by telephone at the time of interpretation on 08/10/2013 at 1:53 pm to Dr. Rubin PayorPickering who verbally acknowledged these results.   Electronically Signed   By: David  [place]   On: 08/10/2013 13:51    Review of Systems  HENT: Positive for ear pain and sore throat.   All other systems reviewed and are negative.  Blood pressure 127/77, pulse 77, temperature 98.9 F (37.2 C), temperature source Oral, resp. rate 20, SpO2 100.00%. Physical Exam  Constitutional: He is oriented to person, place, and time. He appears well-developed and well-nourished. No distress.  HENT:  Head: Normocephalic and atraumatic.  Right Ear: External ear normal.  Left Ear: External ear normal.  Nose: Nose normal.  Left peritonsillar fullness, uvula to the right.  Mild trismus.  Eyes: Conjunctivae and EOM are normal. Pupils are equal, round, and reactive to light.  Neck: Normal range of motion. Neck supple.  Cardiovascular: Normal rate.   Respiratory: Effort normal.  Musculoskeletal: Normal range of motion.  Neurological: He is alert and oriented to person, place, and time. No cranial nerve deficit.  Skin: Skin is warm and dry.  Psychiatric: He has a normal mood and affect. His behavior is normal. Judgment and thought content normal.    Assessment/Plan: Left peritonsillar abscess I personally reviewed his neck CT showing the left-sided peritonsillar abscess.  The abscess was drained at the bedside.  See procedure note.  He can be discharged on oral clindamycin or Augmentin and pain medication and can follow-up with me if symptoms do not resolve fully.  Gary [name] 08/10/2013, 4:52 PM

## 2013-08-10 NOTE — Discharge Instructions (Signed)
Continue to gargle with water, frequently tonight. You can begin a clear liquid diet, tonight. Beginning tomorrow gradually started eating soft food. Beginning antibiotic prescription tomorrow morning. Followup with Dr. Jenne PaneBates if needed for problems. Take ibuprofen every 6 hours as needed for fever.   Peritonsillar Abscess A peritonsillar abscess is a collection of pus located in the back of the throat behind the tonsils. It usually occurs when a streptococcal infection of the throat or tonsils spreads into the space around the tonsils. They are almost always caused by the streptococcal germ (bacteria). The treatment of a peritonsillar abscess is most often drainage accomplished by putting a needle into the abscess or cutting (incising) and draining the abscess. This is most often followed with a course of antibiotics. HOME CARE INSTRUCTIONS  If your abscess was drained by your caregiver today, rinse your throat (gargle) with warm salt water four times per day or as needed for comfort. Do not swallow this mixture. Mix 1 teaspoon of salt in 8 ounces of warm water for gargling.  Rest in bed as needed. Resume activities as able.  Apply cold to your neck for pain relief. Fill a plastic bag with ice and wrap it in a towel. Hold the ice on your neck for 20 minutes 4 times per day.  Eat a soft or liquid diet as tolerated while your throat remains sore. Popsicles and ice cream may be good early choices. Drinking plenty of cold fluids will probably be soothing and help take swelling down in between the warm gargles.  Only take over-the-counter or prescription medicines for pain, discomfort, or fever as directed by your caregiver. Do not use aspirin unless directed by your physician. Aspirin slows down the clotting process. It can also cause bleeding from the drainage area if this was needled or incised today.  If antibiotics were prescribed, take them as directed for the full course of the prescription.  Even if you feel you are well, you need to take them. SEEK MEDICAL CARE IF:   You have increased pain, swelling, redness, or drainage in your throat.  You develop signs of infection such as dizziness, headache, lethargy, or generalized feelings of illness.  You have difficulty breathing, swallowing or eating.  You show signs of becoming dehydrated (lightheadedness when standing, decreased urine output, a fast heart rate, or dry mouth and mucous membranes). SEEK IMMEDIATE MEDICAL CARE IF:   You have a fever.  You are coughing up or vomiting blood.  You develop more severe throat pain uncontrolled with medicines or you start to drool.  You develop difficulty breathing, talking, or find it easier to breathe while leaning forward. Document Released: 01/14/2005 Document Revised: 04/08/2011 Document Reviewed: 08/28/2007 [facility] Patient Information 2015 [employer], [place]. This information is not intended to replace advice given to you by your health care provider. Make sure you discuss any questions you have with your health care provider.

## 2013-08-10 NOTE — Procedures (Signed)
Preop diagnosis: Left peritonsillar abscess Postop diagnosis: same Procedure: Incision and drainage of left peritonsillar abscess Surgeon: Jenne PaneBates Anesth: Local Compl: None Findings: Left peritonsillar fullness.  Cheesy pus drained from abscess. Description: After discussing risks, benefits, and alternatives, the patient was positioned in a seated position.  The left peritonsillar region was sprayed with cetacaine twice.  The area was then injected with 1% lidocaine.  An angled incision was made above the left tonsil using an 11 blade.  The abscess pocket was then entered with a curved hemostat and cheesy pus drained out.  The pocket was opened a couple more times to allow complete drainage.  He was then returned to nursing care in stable condition.

## 2013-08-10 NOTE — ED Provider Notes (Signed)
  Face-to-face evaluation   History: He has had worsening sore throat since being evaluated for same on 08/04/13  Physical exam: Exam after I&D performed by ENT. He is controlling his saliva, able to swallow, and gargling/Spitting up pink tinged fluid. No trismus. Mild left peritonsillar induration.  I&D of peritonsillar abscess was performed by ENT. I discussed the case with Dr. Jenne PaneBates. He recommends oral antibiotics and followup with him, as needed.  Medical screening examination/treatment/procedure(s) were conducted as a shared visit with non-physician practitioner(s) and myself.  I personally evaluated the patient during the encounter  Flint MelterElliott L Anastaisa Wooding, MD 08/12/13 1409

## 2013-08-10 NOTE — ED Notes (Signed)
Pt reports - severe throat pain, no decrease from last visit. Increase r/low jaw pain and swelling.

## 2013-08-10 NOTE — Progress Notes (Signed)
[facility] CL provided pt with a list of primary care resources and a [facility] Orange Card application to help patient establish primary care.  °

## 2013-08-11 LAB — I-STAT CHEM 8, ED
BUN: 7 mg/dL (ref 6–23)
Calcium, Ion: 1.17 mmol/L (ref 1.12–1.23)
Chloride: 104 mEq/L (ref 96–112)
Creatinine, Ser: 1 mg/dL (ref 0.50–1.35)
Glucose, Bld: 101 mg/dL — ABNORMAL HIGH (ref 70–99)
HEMATOCRIT: 44 % (ref 39.0–52.0)
HEMOGLOBIN: 15 g/dL (ref 13.0–17.0)
POTASSIUM: 3.8 meq/L (ref 3.7–5.3)
SODIUM: 141 meq/L (ref 137–147)
TCO2: 26 mmol/L (ref 0–100)

## 2013-08-11 NOTE — ED Provider Notes (Signed)
Medical screening examination/treatment/procedure(s) were performed by non-physician practitioner and as supervising physician I was immediately available for consultation/collaboration.  Berkeley Vanaken T Kazia Grisanti, MD 08/11/13 1511 

## 2013-08-12 LAB — CULTURE, GROUP A STREP

## 2013-08-13 ENCOUNTER — Telehealth (HOSPITAL_BASED_OUTPATIENT_CLINIC_OR_DEPARTMENT_OTHER): Payer: Self-pay | Admitting: Emergency Medicine

## 2013-08-13 NOTE — Telephone Encounter (Signed)
pt returned call, ID verified. Notified of + group A strep and need for additional RX Clindamycin to extend treatment to ten days. [facility] [phone]

## 2013-08-13 NOTE — Telephone Encounter (Signed)
Post ED Visit - Positive Culture Follow-up: Successful Patient Follow-Up  Culture assessed and recommendations reviewed by: []  Wes Dulaney, Pharm.D., BCPS []  Celedonio MiyamotoJeremy Frens, [address] BCPS [x]  Georgina PillionElizabeth [name], [address]Ph[address] BCPS []  [place]Minh Pham, [address]Pharm[address]D., BCPS, AAHIVP []  Estella HuskMichelle Turner, Pharm.D., BCPS, AAHIVP  Positive throat culture  Patient RX'd Clindamycin 300 mg qid x seven days. Per pharmacist and ED PA same treatment needs to be extended to ten days  Changes discussed with ED provider: Sharilyn SitesLisa Sanders PA New antibiotic prescription Clindamycin 300 mg qid x three days  (will extend treatment to ten days)     Jiles HaroldGammons, Omair Dettmer Chaney 08/13/2013, 9:29 AM

## 2013-08-13 NOTE — Progress Notes (Signed)
ED Antimicrobial Stewardship Positive Culture Follow Up   Gary [name] is an 33 y.o. male who [facility] with a chief complaint of sore throat  Chief Complaint  Patient presents with  . Sore Throat    pt reports persistent sthoat pain  . Dental Pain    r/lower jaw pain and swelling  . Oral Swelling    Recent Results (from the past 720 hour(s))  RAPID STREP SCREEN     Status: None   Collection Time    08/05/13  1:01 AM      Result Value Ref Range Status   Streptococcus, Group A Screen (Direct) NEGATIVE  NEGATIVE Final   Comment: (NOTE)     A Rapid Antigen test may result negative if the antigen level in the     sample is below the detection level of this test. The FDA has not     cleared this test as a stand-alone test therefore the rapid antigen     negative result has reflexed to a Group A Strep culture.  CULTURE, GROUP A STREP     Status: None   Collection Time    08/05/13  1:01 AM      Result Value Ref Range Status   Specimen Description THROAT   Final   Special Requests NONE   Final   Culture     Final   Value: GROUP A STREP (S.PYOGENES) ISOLATED     Performed at [facility]   Report Status 08/07/2013 FINAL   Final  RAPID STREP SCREEN     Status: None   Collection Time    08/10/13 12:09 PM      Result Value Ref Range Status   Streptococcus, Group A Screen (Direct) NEGATIVE  NEGATIVE Final   Comment: (NOTE)     A Rapid Antigen test may result negative if the antigen level in the     sample is below the detection level of this test. The FDA has not     cleared this test as a stand-alone test therefore the rapid antigen     negative result has reflexed to a Group A Strep culture.  CULTURE, GROUP A STREP     Status: None   Collection Time    08/10/13 12:09 PM      Result Value Ref Range Status   Specimen Description THROAT   Final   Special Requests NONE   Final   Culture     Final   Value: GROUP A STREP (S.PYOGENES) ISOLATED   Performed at [facility]   Report Status 08/12/2013 FINAL   Final    [x]  Treated with Clindamycin - organism likely sensitive to antibiotic but require a longer treatment duration for the indication  33 YOM with c/o sore throate and jaw pain/swelling. Rapid strep was negative. ENT consulted due to a CT that showed a peritonsillar abscess >> underwent an I&D at bedside. Cx now growing GAS - the patient received an appropriate antibiotic however the LOT was 7 days when 10 days is recommended for GAS. Will plan to extend treatment duration.  New antibiotic prescription: Clindamycin 300 mg 1 capsule 4 (four) times daily for 3 more days (to complete a total treatment duration of 10 days)  ED Provider: Sharilyn SitesLisa Sanders, PA-C  Rolley SimsMartin, [name] 08/13/2013, 8:40 AM Infectious Diseases Pharmacist Phone# [phone]

## 2014-07-31 ENCOUNTER — Emergency Department (HOSPITAL_COMMUNITY)
Admission: EM | Admit: 2014-07-31 | Discharge: 2014-07-31 | Disposition: A | Discharge status: Home / Self Care | Payer: Self-pay | Attending: Emergency Medicine | Admitting: Emergency Medicine

## 2014-07-31 ENCOUNTER — Encounter (HOSPITAL_COMMUNITY): Payer: Self-pay

## 2014-07-31 ENCOUNTER — Emergency Department (HOSPITAL_COMMUNITY): Payer: Self-pay

## 2014-07-31 DIAGNOSIS — S0121XA Laceration without foreign body of nose, initial encounter: Secondary | ICD-10-CM | POA: Insufficient documentation

## 2014-07-31 DIAGNOSIS — Y929 Unspecified place or not applicable: Secondary | ICD-10-CM | POA: Insufficient documentation

## 2014-07-31 DIAGNOSIS — S61432A Puncture wound without foreign body of left hand, initial encounter: Secondary | ICD-10-CM | POA: Insufficient documentation

## 2014-07-31 DIAGNOSIS — R0789 Other chest pain: Secondary | ICD-10-CM

## 2014-07-31 DIAGNOSIS — W503XXA Accidental bite by another person, initial encounter: Secondary | ICD-10-CM

## 2014-07-31 DIAGNOSIS — Y999 Unspecified external cause status: Secondary | ICD-10-CM | POA: Insufficient documentation

## 2014-07-31 DIAGNOSIS — Z720 Tobacco use: Secondary | ICD-10-CM | POA: Insufficient documentation

## 2014-07-31 DIAGNOSIS — S299XXA Unspecified injury of thorax, initial encounter: Secondary | ICD-10-CM | POA: Insufficient documentation

## 2014-07-31 DIAGNOSIS — Y939 Activity, unspecified: Secondary | ICD-10-CM | POA: Insufficient documentation

## 2014-07-31 DIAGNOSIS — S61259A Open bite of unspecified finger without damage to nail, initial encounter: Secondary | ICD-10-CM

## 2014-07-31 MED ORDER — AMOXICILLIN-POT CLAVULANATE 875-125 MG PO TABS: 1.0000 | ORAL_TABLET | Freq: Two times a day (BID) | ORAL | Status: DC

## 2014-07-31 MED ORDER — HYDROGEN PEROXIDE 3 % EX SOLN
CUTANEOUS | Status: AC
  Administered 2014-07-31: 1
  Filled 2014-07-31: qty 473

## 2014-07-31 MED ORDER — OXYCODONE-ACETAMINOPHEN 5-325 MG PO TABS
2.0000 | ORAL_TABLET | Freq: Once | ORAL | Status: AC
  Administered 2014-07-31: 2 via ORAL
  Filled 2014-07-31: qty 2

## 2014-07-31 MED ORDER — AMOXICILLIN-POT CLAVULANATE 875-125 MG PO TABS
1.0000 | ORAL_TABLET | Freq: Once | ORAL | Status: AC
  Administered 2014-07-31: 1 via ORAL
  Filled 2014-07-31: qty 1
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
## 2014-07-31 NOTE — ED Notes (Signed)
Bed: WLPT3 Expected date:  Expected time:  Means of arrival:  Comments: EMS 
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
## 2015-09-20 ENCOUNTER — Emergency Department (HOSPITAL_COMMUNITY)
Admission: EM | Admit: 2015-09-20 | Discharge: 2015-09-20 | Disposition: A | Discharge status: Home / Self Care | Payer: Self-pay | Attending: Dermatology | Admitting: Dermatology

## 2015-09-20 DIAGNOSIS — J029 Acute pharyngitis, unspecified: Secondary | ICD-10-CM | POA: Insufficient documentation

## 2015-09-20 DIAGNOSIS — Z5321 Procedure and treatment not carried out due to patient leaving prior to being seen by health care provider: Secondary | ICD-10-CM | POA: Insufficient documentation

## 2015-09-20 LAB — RAPID STREP SCREEN (MED CTR MEBANE ONLY): Streptococcus, Group A Screen (Direct): POSITIVE — AB

## 2015-09-20 MED ORDER — ACETAMINOPHEN 325 MG PO TABS
650.0000 mg | ORAL_TABLET | Freq: Once | ORAL | Status: AC | PRN
  Administered 2015-09-20: 650 mg via ORAL
  Filled 2015-09-20: qty 2

## 2015-09-20 NOTE — ED Triage Notes (Addendum)
Pt states that he has had a sore throat, mainly on the R side since yesterday. Also reports R ear pain. Hx of peritonsillar abscess. Alert and oriented.

## 2015-09-20 NOTE — ED Notes (Signed)
Pt stated he cannot wait any longer and has to leave.

## 2023-01-26 ENCOUNTER — Emergency Department (HOSPITAL_COMMUNITY)
Admission: EM | Admit: 2023-01-26 | Discharge: 2023-01-26 | Discharge status: Against Medical Advice | Payer: Self-pay | Attending: Medical | Admitting: Medical

## 2023-01-26 ENCOUNTER — Encounter (HOSPITAL_COMMUNITY): Payer: Self-pay | Admitting: Emergency Medicine

## 2023-01-26 ENCOUNTER — Emergency Department (HOSPITAL_COMMUNITY): Payer: Self-pay

## 2023-01-26 ENCOUNTER — Other Ambulatory Visit: Payer: Self-pay

## 2023-01-26 DIAGNOSIS — M25572 Pain in left ankle and joints of left foot: Secondary | ICD-10-CM | POA: Diagnosis not present

## 2023-01-26 DIAGNOSIS — M19072 Primary osteoarthritis, left ankle and foot: Secondary | ICD-10-CM | POA: Diagnosis not present

## 2023-01-26 DIAGNOSIS — Z5321 Procedure and treatment not carried out due to patient leaving prior to being seen by health care provider: Secondary | ICD-10-CM | POA: Diagnosis not present

## 2023-01-26 DIAGNOSIS — M79672 Pain in left foot: Secondary | ICD-10-CM | POA: Insufficient documentation

## 2023-01-26 MED ORDER — IBUPROFEN 800 MG PO TABS
800.0000 mg | ORAL_TABLET | Freq: Once | ORAL | Status: AC
  Administered 2023-01-26: 800 mg via ORAL
  Filled 2023-01-26: qty 1

## 2023-01-26 NOTE — ED Notes (Signed)
 Pt decided to leave before going to a room.

## 2023-01-26 NOTE — ED Triage Notes (Signed)
Pt has been walking a lot today and then experienced severe pain and swelling to the left foot. No known injury.  Pt states he is very painful.

## 2023-01-26 NOTE — ED Provider Triage Note (Cosign Needed)
Emergency Medicine Provider Triage Evaluation Note  Gary [name] , a 42 y.o. male  was evaluated in triage.  Pt complains of L foot pain after going on a long walk last night. Unable to bear weight. Denies redness, but endorse swelling.  Review of Systems  Positive: Foot pain Negative: redness  Physical Exam  BP 129/84 (BP Location: Right Arm)   Pulse 98   Temp 99.2 F (37.3 C)   Resp 16   SpO2 97%  Gen:   Awake, no distress   Resp:  Normal effort  MSK:   Moves extremities without difficulty  Other:  +midfoot ttp, +pulse  Medical Decision Making  Medically screening exam initiated at 3:24 PM.  Appropriate orders placed.  Yansel Bentz was informed that the remainder of the evaluation will be completed by another provider, this initial triage assessment does not replace that evaluation, and the importance of remaining in the ED until their evaluation is complete.     Pete Pelt, [place] 01/26/23 1526

## 2023-06-28 ENCOUNTER — Emergency Department (HOSPITAL_COMMUNITY): Payer: Self-pay

## 2023-06-28 ENCOUNTER — Emergency Department (HOSPITAL_COMMUNITY)
Admission: EM | Admit: 2023-06-28 | Discharge: 2023-06-28 | Disposition: A | Discharge status: Home / Self Care | Payer: Self-pay | Attending: Emergency Medicine | Admitting: Emergency Medicine

## 2023-06-28 ENCOUNTER — Other Ambulatory Visit: Payer: Self-pay

## 2023-06-28 DIAGNOSIS — T148XXA Other injury of unspecified body region, initial encounter: Secondary | ICD-10-CM

## 2023-06-28 DIAGNOSIS — Z23 Encounter for immunization: Secondary | ICD-10-CM | POA: Insufficient documentation

## 2023-06-28 DIAGNOSIS — S41012A Laceration without foreign body of left shoulder, initial encounter: Secondary | ICD-10-CM | POA: Insufficient documentation

## 2023-06-28 DIAGNOSIS — W260XXA Contact with knife, initial encounter: Secondary | ICD-10-CM | POA: Insufficient documentation

## 2023-06-28 MED ORDER — CEPHALEXIN 500 MG PO CAPS: 500.0000 mg | ORAL_CAPSULE | Freq: Four times a day (QID) | ORAL | 0 refills | Status: DC

## 2023-06-28 MED ORDER — TETANUS-DIPHTH-ACELL PERTUSSIS 5-2.5-18.5 LF-MCG/0.5 IM SUSY
0.5000 mL | PREFILLED_SYRINGE | Freq: Once | INTRAMUSCULAR | Status: AC
  Administered 2023-06-28: 0.5 mL via INTRAMUSCULAR
  Filled 2023-06-28: qty 0.5

## 2023-06-28 MED ORDER — LIDOCAINE HCL (PF) 1 % IJ SOLN
5.0000 mL | Freq: Once | INTRAMUSCULAR | Status: AC
  Administered 2023-06-28: 5 mL
  Filled 2023-06-28: qty 5
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
## 2023-06-28 NOTE — Discharge Instructions (Addendum)
 You were evaluated in the emergency room for a puncture wound.  This was repaired.  Your x-ray showed no retained foreign body or bony abnormality.  Please call the number on the sheet to schedule with orthopedics.  Prescription for antibiotics has been sent into this pharmacy.  Please take this as prescribed.
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
## 2024-01-05 ENCOUNTER — Other Ambulatory Visit: Payer: Self-pay

## 2024-01-05 ENCOUNTER — Encounter (HOSPITAL_COMMUNITY): Payer: Self-pay

## 2024-01-05 ENCOUNTER — Other Ambulatory Visit (HOSPITAL_COMMUNITY): Payer: Self-pay

## 2024-01-05 ENCOUNTER — Ambulatory Visit (HOSPITAL_COMMUNITY)
Admission: EM | Admit: 2024-01-05 | Discharge: 2024-01-05 | Disposition: A | Discharge status: Home / Self Care | Payer: Self-pay | Attending: Family Medicine | Admitting: Family Medicine

## 2024-01-05 DIAGNOSIS — K047 Periapical abscess without sinus: Secondary | ICD-10-CM

## 2024-01-05 MED ORDER — KETOROLAC TROMETHAMINE 30 MG/ML IJ SOLN
INTRAMUSCULAR | Status: AC
  Filled 2024-01-05: qty 1

## 2024-01-05 MED ORDER — KETOROLAC TROMETHAMINE 30 MG/ML IJ SOLN
30.0000 mg | Freq: Once | INTRAMUSCULAR | Status: AC
  Administered 2024-01-05: 30 mg via INTRAMUSCULAR

## 2024-01-05 MED ORDER — KETOROLAC TROMETHAMINE 10 MG PO TABS
10.0000 mg | ORAL_TABLET | Freq: Four times a day (QID) | ORAL | 0 refills | Status: AC | PRN
  Filled 2024-01-05 (×2): qty 20, 5d supply, fill #0

## 2024-01-05 MED ORDER — AMOXICILLIN-POT CLAVULANATE 875-125 MG PO TABS
1.0000 | ORAL_TABLET | Freq: Two times a day (BID) | ORAL | 0 refills | Status: AC
  Filled 2024-01-05 (×2): qty 14, 7d supply, fill #0

## 2024-01-05 NOTE — ED Provider Notes (Signed)
 [facility]    CSN: [id number] Arrival date & time: 01/05/24  1417      History   Chief Complaint Chief Complaint  Patient presents with   Dental Pain    HPI Gary [name] is a 43 y.o. male.    Dental Pain  Here for pain in his right lower jaw and teeth.  It began about 3 weeks ago to bother him when he would chew and then in the last 24 hours it swelled a lot and has become much more painful.  No fever or chills and no nausea or vomiting.  NKDA  He does not have a dentist  History reviewed. No pertinent past medical history.  There are no active problems to display for this patient.   Past Surgical History:  Procedure Laterality Date   LAPAROSCOPIC APPENDECTOMY  05/04/2011   Procedure: APPENDECTOMY LAPAROSCOPIC;  Surgeon: Jina Nephew, MD;  Location: [facility] OR;  Service: General;  Laterality: N/A;       Home Medications    Prior to Admission medications   Medication Sig Start Date End Date Taking? Authorizing Provider  amoxicillin -clavulanate (AUGMENTIN ) 875-125 MG tablet Take 1 tablet by mouth 2 (two) times daily for 7 days. 01/05/24 01/12/24 Yes Vonna Sharlet POUR, MD  ketorolac  (TORADOL ) 10 MG tablet Take 1 tablet (10 mg total) by mouth every 6 (six) hours as needed (pain). 01/05/24  Yes Vonna Sharlet POUR, MD    Family History History reviewed. No pertinent family history.  Social History Social History   Tobacco Use   Smoking status: Every Day    Current packs/day: 1.00    Types: Cigarettes  Vaping Use   Vaping status: Never Used  Substance Use Topics   Alcohol use: Yes   Drug use: Never     Allergies   Patient has no known allergies.   Review of Systems Review of Systems   Physical Exam Triage Vital Signs ED Triage Vitals  Encounter Vitals Group     BP 01/05/24 1602 125/80     Girls Systolic BP Percentile --      Girls Diastolic BP Percentile --      Boys Systolic BP Percentile --      Boys Diastolic BP Percentile --       Pulse Rate 01/05/24 1602 85     Resp 01/05/24 1602 16     Temp 01/05/24 1602 99.1 F (37.3 C)     Temp Source 01/05/24 1602 Oral     SpO2 01/05/24 1602 94 %     Weight --      Height --      Head Circumference --      Peak Flow --      Pain Score 01/05/24 1558 9     Pain Loc --      Pain Education --      Exclude from Growth Chart --    No data found.  Updated Vital Signs BP 125/80 (BP Location: Left Arm)   Pulse 85   Temp 99.1 F (37.3 C) (Oral)   Resp 16   SpO2 94%   Visual Acuity Right Eye Distance:   Left Eye Distance:   Bilateral Distance:    Right Eye Near:   Left Eye Near:    Bilateral Near:     Physical Exam Vitals reviewed.  Constitutional:      General: He is not in acute distress.    Appearance: He is not ill-appearing, toxic-appearing or diaphoretic.  HENT:     Mouth/Throat:     Mouth: Mucous membranes are moist.     Comments: There is some swelling of the external right cheek.  Also there is some erythema and swelling of the gum line on the lateral right lower dental ridge. Eyes:     Extraocular Movements: Extraocular movements intact.     Conjunctiva/sclera: Conjunctivae normal.     Pupils: Pupils are equal, round, and reactive to light.  Cardiovascular:     Rate and Rhythm: Normal rate and regular rhythm.     Heart sounds: No murmur heard. Pulmonary:     Effort: Pulmonary effort is normal.     Breath sounds: Normal breath sounds.  Musculoskeletal:     Cervical back: Neck supple.  Skin:    Coloration: Skin is not pale.  Neurological:     General: No focal deficit present.     Mental Status: He is alert and oriented to person, place, and time.  Psychiatric:        Behavior: Behavior normal.      UC Treatments / Results  Labs (all labs ordered are listed, but only abnormal results are displayed) Labs Reviewed - No data to display  EKG   Radiology No results found.  Procedures Procedures (including critical care  time)  Medications Ordered in UC Medications  ketorolac  (TORADOL ) 30 MG/ML injection 30 mg (has no administration in time range)    Initial Impression / Assessment and Plan / UC Course  I have reviewed the triage vital signs and the nursing notes.  Pertinent labs & imaging results that were available during my care of the patient were reviewed by me and considered in my medical decision making (see chart for details).     Toradol  injection is given here and Toradol  tablets are sent to the pharmacy for the pain.  Augmentin  is sent in for the dental infection  He is given a list of low-cost dental providers. Final Clinical Impressions(s) / UC Diagnoses   Final diagnoses:  Dental infection     Discharge Instructions      You have been given a shot of Toradol  30 mg today.  Take amoxicillin -clavulanate 875 mg--1 tab twice daily with food for 7 days  Ketorolac  10 mg tablets--take 1 tablet every 6 hours as needed for pain.  This is the same medicine that is in the shot we just gave you  Please follow-up with a dentist about this issue     ED Prescriptions     Medication Sig Dispense Auth. Provider   amoxicillin -clavulanate (AUGMENTIN ) 875-125 MG tablet Take 1 tablet by mouth 2 (two) times daily for 7 days. 14 tablet Latiqua Daloia K, MD   ketorolac  (TORADOL ) 10 MG tablet Take 1 tablet (10 mg total) by mouth every 6 (six) hours as needed (pain). 20 tablet Dhairya Corales K, MD      PDMP not reviewed this encounter.   Vonna Sharlet POUR, MD 01/05/24 [phone]

## 2024-01-05 NOTE — Discharge Instructions (Signed)
 You have been given a shot of Toradol  30 mg today.  Take amoxicillin -clavulanate 875 mg--1 tab twice daily with food for 7 days  Ketorolac  10 mg tablets--take 1 tablet every 6 hours as needed for pain.  This is the same medicine that is in the shot we just gave you  Please follow-up with a dentist about this issue

## 2024-01-05 NOTE — ED Triage Notes (Signed)
 Patient here today with c/o right lower dental pain and swelling X 2 days. Patient states that he did notice some pain with chewing a month ago but that went away and came back a week ago.
